# Patient Record
Sex: Male | Born: 1980 | Race: Asian | Hispanic: No | Marital: Married | Smoking: Former smoker
Health system: Southern US, Community
[De-identification: ages and names within clinical notes are randomized; demographics above are authoritative.]

## PROBLEM LIST (undated history)

## (undated) DIAGNOSIS — I10 Essential (primary) hypertension: Secondary | ICD-10-CM

## (undated) DIAGNOSIS — E7800 Pure hypercholesterolemia, unspecified: Secondary | ICD-10-CM

---

## 2011-05-10 ENCOUNTER — Ambulatory Visit: Payer: Self-pay | Admitting: Internal Medicine

## 2011-05-10 LAB — CREATININE, SERUM
Creatinine: 0.88 mg/dL (ref 0.60–1.30)
EGFR (African American): >60

## 2015-01-27 ENCOUNTER — Encounter: Payer: Self-pay | Admitting: *Deleted

## 2015-01-30 ENCOUNTER — Ambulatory Visit
Admission: RE | Admit: 2015-01-30 | Discharge: 2015-01-30 | Disposition: A | Discharge status: Home / Self Care | Payer: Managed Care, Other (non HMO) | Source: Ambulatory Visit | Attending: Gastroenterology | Admitting: Gastroenterology

## 2015-01-30 ENCOUNTER — Ambulatory Visit: Payer: Managed Care, Other (non HMO) | Admitting: Anesthesiology

## 2015-01-30 ENCOUNTER — Encounter
Admission: RE | Disposition: A | Discharge status: Home / Self Care | Payer: Self-pay | Source: Ambulatory Visit | Attending: Gastroenterology

## 2015-01-30 DIAGNOSIS — Z79899 Other long term (current) drug therapy: Secondary | ICD-10-CM | POA: Insufficient documentation

## 2015-01-30 DIAGNOSIS — K2970 Gastritis, unspecified, without bleeding: Secondary | ICD-10-CM | POA: Insufficient documentation

## 2015-01-30 DIAGNOSIS — K2980 Duodenitis without bleeding: Secondary | ICD-10-CM | POA: Insufficient documentation

## 2015-01-30 DIAGNOSIS — K219 Gastro-esophageal reflux disease without esophagitis: Secondary | ICD-10-CM | POA: Diagnosis not present

## 2015-01-30 DIAGNOSIS — Z87891 Personal history of nicotine dependence: Secondary | ICD-10-CM | POA: Insufficient documentation

## 2015-01-30 DIAGNOSIS — E7800 Pure hypercholesterolemia, unspecified: Secondary | ICD-10-CM | POA: Insufficient documentation

## 2015-01-30 DIAGNOSIS — I10 Essential (primary) hypertension: Secondary | ICD-10-CM | POA: Insufficient documentation

## 2015-01-30 DIAGNOSIS — K208 Other esophagitis: Secondary | ICD-10-CM | POA: Insufficient documentation

## 2015-01-30 DIAGNOSIS — R1013 Epigastric pain: Secondary | ICD-10-CM | POA: Diagnosis present

## 2015-01-30 HISTORY — PX: ESOPHAGOGASTRODUODENOSCOPY (EGD) WITH PROPOFOL: SHX5813

## 2015-01-30 HISTORY — DX: Essential (primary) hypertension: I10

## 2015-01-30 HISTORY — DX: Pure hypercholesterolemia, unspecified: E78.00

## 2015-01-30 SURGERY — ESOPHAGOGASTRODUODENOSCOPY (EGD) WITH PROPOFOL
Anesthesia: General

## 2015-01-30 MED ORDER — GLYCOPYRROLATE 0.2 MG/ML IJ SOLN
INTRAMUSCULAR | Status: DC | PRN
Start: 2015-01-30 — End: 2015-01-30
  Administered 2015-01-30: 0.2 mg via INTRAVENOUS

## 2015-01-30 MED ORDER — FENTANYL CITRATE (PF) 100 MCG/2ML IJ SOLN
INTRAMUSCULAR | Status: DC | PRN
  Administered 2015-01-30: 50 ug via INTRAVENOUS

## 2015-01-30 MED ORDER — PROPOFOL 500 MG/50ML IV EMUL: INTRAVENOUS | Status: DC | PRN

## 2015-01-30 MED ORDER — PROPOFOL 10 MG/ML IV BOLUS
INTRAVENOUS | Status: DC | PRN
  Administered 2015-01-30: 100 mg via INTRAVENOUS

## 2015-01-30 MED ORDER — LIDOCAINE HCL (CARDIAC) 20 MG/ML IV SOLN
INTRAVENOUS | Status: DC | PRN
  Administered 2015-01-30: 100 mg via INTRAVENOUS

## 2015-01-30 MED ORDER — SODIUM CHLORIDE 0.9 % IV SOLN
INTRAVENOUS | Status: DC
  Administered 2015-01-30: 16:00:00 via INTRAVENOUS

## 2015-01-30 MED ORDER — SODIUM CHLORIDE 0.9 % IV SOLN
INTRAVENOUS | Status: DC
  Administered 2015-01-30: 15:00:00 via INTRAVENOUS

## 2015-01-30 MED ORDER — MIDAZOLAM HCL 5 MG/5ML IJ SOLN
INTRAMUSCULAR | Status: DC | PRN
  Administered 2015-01-30: 2 mg via INTRAVENOUS

## 2015-01-30 NOTE — Transfer of Care (Signed)
Immediate Anesthesia Transfer of Care Note  Patient: Daniel Hudson  Procedure(s) Performed: Procedure(s): ESOPHAGOGASTRODUODENOSCOPY (EGD) WITH PROPOFOL (N/A)  Patient Location: PACU  Anesthesia Type:General  Level of Consciousness: sedated  Airway & Oxygen Therapy: Patient Spontanous Breathing and Patient connected to nasal cannula oxygen  Post-op Assessment: Report given to RN and Post -op Vital signs reviewed and stable  Post vital signs: Reviewed and stable  Last Vitals:  Filed Vitals:   01/30/15 1437  BP: 112/81  Temp: 37.1 C  Resp: 20    Complications: No apparent anesthesia complications

## 2015-01-30 NOTE — Anesthesia Preprocedure Evaluation (Signed)
Anesthesia Evaluation  Patient identified by MRN, date of birth, ID band Patient awake    Reviewed: Allergy & Precautions, NPO status , Patient's Chart, lab work & pertinent test results  Airway Mallampati: II       Dental  (+) Teeth Intact   Pulmonary former smoker,    Pulmonary exam normal        Cardiovascular hypertension, Pt. on medications negative cardio ROS Normal cardiovascular exam     Neuro/Psych negative neurological ROS     GI/Hepatic negative GI ROS, Neg liver ROS,   Endo/Other  negative endocrine ROS  Renal/GU negative Renal ROS     Musculoskeletal negative musculoskeletal ROS (+)   Abdominal Normal abdominal exam  (+)   Peds negative pediatric ROS (+)  Hematology negative hematology ROS (+)   Anesthesia Other Findings   Reproductive/Obstetrics negative OB ROS                             Anesthesia Physical Anesthesia Plan  ASA: II  Anesthesia Plan: General   Post-op Pain Management:    Induction: Intravenous  Airway Management Planned: Nasal Cannula  Additional Equipment:   Intra-op Plan:   Post-operative Plan:   Informed Consent: I have reviewed the patients History and Physical, chart, labs and discussed the procedure including the risks, benefits and alternatives for the proposed anesthesia with the patient or authorized representative who has indicated his/her understanding and acceptance.     Plan Discussed with: CRNA  Anesthesia Plan Comments:         Anesthesia Quick Evaluation

## 2015-01-30 NOTE — Op Note (Addendum)
[name] Gastroenterology Patient Name: Daniel MastersSaeed Reinig Procedure Date: 01/30/2015 3:43 PM MRN: [id number] Account #: [id number] Date of Birth: 02/05/1981 Admit Type: Outpatient Age: [age] Room[facility] ENDO ROOM 3 Gender: Male Note Status: Supervisor Override Procedure:         Upper GI endoscopy Indications:       Dyspepsia Providers:         Christena DeemMartin U. Skulskie, MD Referring MD:      Silas FloodSheikh A. Ellsworth Lennoxejan-sie, MD (Referring MD) Medicines:         Monitored Anesthesia Care Complications:     No immediate complications. Procedure:         Pre-Anesthesia Assessment:                    - ASA Grade Assessment: II - A patient with mild systemic                     disease.                    After obtaining informed consent, the endoscope was passed                     under direct vision. Throughout the procedure, the                     patient's blood pressure, pulse, and oxygen saturations                     were monitored continuously. The Endoscope was introduced                     through the mouth, and advanced to the third part of                     duodenum. The patient tolerated the procedure well. Findings:      LA Grade B (one or more mucosal breaks greater than 5 mm, not extending       between the tops of two mucosal folds) esophagitis with no bleeding was       found. Biopsies were taken with a cold forceps for histology.      Diffuse minimal erythematous mucosa without bleeding was found in the       gastric body. Biopsies were taken with a cold forceps for histology.       Biopsies were taken with a cold forceps for Helicobacter pylori testing.      The cardia and gastric fundus were normal on retroflexion.      Patchy moderate inflammation characterized by congestion (edema),       erosions and erythema was found in the posterior duodenal bulb and in       the proximal second part of the duodenum.      small nodule, possible granuloma above the vocal  cord. Impression:        - LA Grade B erosive esophagitis. Biopsied.                    - Erythematous mucosa in the gastric body. Biopsied.                    - Erosive duodenitis. Recommendation:    - Use Protonix (pantoprazole) 40 mg PO BID for 1 month.                    -  Await pathology results.                    - Return to GI clinic in 1 month.                    - Refer to an ENT specialist at appointment to be                     scheduled. Procedure Code(s): --- Professional ---                    [id number], Esophagogastroduodenoscopy, flexible, transoral;                     with biopsy, single or multiple Diagnosis Code(s): --- Professional ---                    530.19, Other esophagitis                    537.89, Other specified disorders of stomach and duodenum                    535.60, Duodenitis, without mention of hemorrhage                    536.8, Dyspepsia and other specified disorders of function                     of stomach CPT copyright 2014 American Medical Association. All rights reserved. The codes documented in this report are preliminary and upon coder review may  be revised to meet current compliance requirements. [name], MD 01/30/2015 4:07:59 PM This report has been signed electronically. Number of Addenda: 0 Note Initiated On: 01/30/2015 3:43 PM      [facility]

## 2015-01-30 NOTE — Anesthesia Postprocedure Evaluation (Signed)
  Anesthesia Post-op Note  Patient: Daniel MastersSaeed Hudson  Procedure(s) Performed: Procedure(s): ESOPHAGOGASTRODUODENOSCOPY (EGD) WITH PROPOFOL (N/A)  Anesthesia type:General  Patient location: PACU  Post pain: Pain level controlled  Post assessment: Post-op Vital signs reviewed, Patient's Cardiovascular Status Stable, Respiratory Function Stable, Patent Airway and No signs of Nausea or vomiting  Post vital signs: Reviewed and stable  Last Vitals:  Filed Vitals:   01/30/15 1655  BP: 132/89  Pulse:   Temp:   Resp:     Level of consciousness: awake, alert  and patient cooperative  Complications: No apparent anesthesia complications

## 2015-01-30 NOTE — H&P (Signed)
Outpatient short stay form Pre-procedure 01/30/2015 3:32 PM Daniel Hudson Renada Cronin MD  Primary Physician: Dr. Ellsworth Lennoxejan-Sie  Reason for visit:  EGD  History of present illness:  Patient is a 34 year old male presenting with complaint of dyspepsia. He also describes reflux and heartburn symptoms. He states that times his time will appear coated white. Not noted any on the inside of his cheeks. Icy foods seem to make symptoms worse and will occasionally trigger loose stools. He has been taking pantoprazole 40 mg daily which helps with the pain but not the discomfort otherwise. He had been taking it after dinner instead of before. When he saw Mrs. London she tolerated to take it appropriately before a meal.  He denies the use of aspirin or NSAID products. He does take Tylenol No. 3 or headaches.    Current facility-administered medications:  .  0.9 %  sodium chloride infusion, , Intravenous, Continuous, Daniel Hudson Raidyn Wassink, MD, Last Rate: 20 mL/hr at 01/30/15 1450 .  0.9 %  sodium chloride infusion, , Intravenous, Continuous, Daniel Hudson Leshon Armistead, MD  Prescriptions prior to admission  Medication Sig Dispense Refill Last Dose  . acetaminophen-codeine (TYLENOL #3) 300-30 MG tablet Take 1 tablet by mouth every 4 (four) hours as needed for moderate pain.   Past Week at Unknown time  . escitalopram (LEXAPRO) 10 MG tablet Take 10 mg by mouth daily.   01/29/2015 at Unknown time  . pantoprazole (PROTONIX) 40 MG tablet Take 40 mg by mouth daily.   01/29/2015 at Unknown time  . propranolol (INDERAL) 40 MG tablet Take 40 mg by mouth 3 (three) times daily.   01/29/2015 at Unknown time  . triamcinolone cream (KENALOG) 0.5 % Apply 1 application topically 3 (three) times daily.   Not Taking at Unknown time     No Known Allergies   Past Medical History  Diagnosis Date  . Hypertension   . High cholesterol     Review of systems:      Physical Exam    Heart and lungs: Regular rate and rhythm without rub or  gallop, lungs are bilaterally clear    HEENT: Normocephalic atraumatic eyes are anicteric    Other:     Pertinant exam for procedure: Soft nontender nondistended bowel sounds positive normoactive    Planned proceedures: EGD and indicated procedures I have discussed the risks benefits and complications of procedures to include not limited to bleeding, infection, perforation and the risk of sedation and the patient wishes to proceed.    Daniel Hudson Tamekia Rotter, MD Gastroenterology 01/30/2015  3:32 PM

## 2015-02-01 LAB — SURGICAL PATHOLOGY

## 2015-02-02 ENCOUNTER — Encounter: Payer: Self-pay | Admitting: Gastroenterology

## 2017-05-25 DIAGNOSIS — I4891 Unspecified atrial fibrillation: Secondary | ICD-10-CM | POA: Diagnosis not present

## 2017-05-25 DIAGNOSIS — I10 Essential (primary) hypertension: Secondary | ICD-10-CM | POA: Insufficient documentation

## 2017-05-25 DIAGNOSIS — R61 Generalized hyperhidrosis: Secondary | ICD-10-CM | POA: Diagnosis not present

## 2017-05-25 DIAGNOSIS — F17210 Nicotine dependence, cigarettes, uncomplicated: Secondary | ICD-10-CM | POA: Diagnosis not present

## 2017-05-25 DIAGNOSIS — R42 Dizziness and giddiness: Secondary | ICD-10-CM | POA: Insufficient documentation

## 2017-05-25 DIAGNOSIS — Z79899 Other long term (current) drug therapy: Secondary | ICD-10-CM | POA: Diagnosis not present

## 2017-05-25 DIAGNOSIS — R51 Headache: Secondary | ICD-10-CM | POA: Diagnosis present

## 2017-05-25 NOTE — ED Triage Notes (Signed)
Pt reports migraine since 1500 today, getting worse despite taking his pain medication. Ems concerned about pt's presentation, unable to auscultate bp, however able to palpate a systolic pressure, pt reports vomiting 4 times since onset of headache

## 2017-05-26 ENCOUNTER — Other Ambulatory Visit: Payer: Self-pay

## 2017-05-26 ENCOUNTER — Emergency Department
Admission: EM | Admit: 2017-05-26 | Discharge: 2017-05-26 | Disposition: A | Discharge status: Home / Self Care | Payer: Managed Care, Other (non HMO) | Attending: Emergency Medicine | Admitting: Emergency Medicine

## 2017-05-26 ENCOUNTER — Emergency Department: Payer: Managed Care, Other (non HMO)

## 2017-05-26 ENCOUNTER — Encounter: Payer: Self-pay | Admitting: Emergency Medicine

## 2017-05-26 DIAGNOSIS — R519 Headache, unspecified: Secondary | ICD-10-CM

## 2017-05-26 DIAGNOSIS — R51 Headache: Secondary | ICD-10-CM

## 2017-05-26 DIAGNOSIS — I4891 Unspecified atrial fibrillation: Secondary | ICD-10-CM

## 2017-05-26 LAB — CBC
HEMATOCRIT: 46.2 % (ref 40.0–52.0)
Hemoglobin: 15.5 g/dL (ref 13.0–18.0)
MCH: 25.2 pg — AB (ref 26.0–34.0)
MCHC: 33.5 g/dL (ref 32.0–36.0)
MCV: 75.4 fL — AB (ref 80.0–100.0)
Platelets: 232 10*3/uL (ref 150–440)
RBC: 6.13 MIL/uL — AB (ref 4.40–5.90)
RDW: 13.7 % (ref 11.5–14.5)
WBC: 12.2 10*3/uL — AB (ref 3.8–10.6)

## 2017-05-26 LAB — COMPREHENSIVE METABOLIC PANEL
ALBUMIN: 4.6 g/dL (ref 3.5–5.0)
ALK PHOS: 90 U/L (ref 38–126)
ALT: 28 U/L (ref 17–63)
AST: 25 U/L (ref 15–41)
Anion gap: 9 (ref 5–15)
BILIRUBIN TOTAL: 0.7 mg/dL (ref 0.3–1.2)
BUN: 11 mg/dL (ref 6–20)
CALCIUM: 9.2 mg/dL (ref 8.9–10.3)
CO2: 24 mmol/L (ref 22–32)
Chloride: 101 mmol/L (ref 101–111)
Creatinine, Ser: 0.81 mg/dL (ref 0.61–1.24)
GFR calc Af Amer: >60 mL/min (ref >60)
GFR calc non Af Amer: >60 mL/min (ref >60)
GLUCOSE: 195 mg/dL — AB (ref 65–99)
Potassium: 4.2 mmol/L (ref 3.5–5.1)
Sodium: 134 mmol/L — ABNORMAL LOW (ref 135–145)
TOTAL PROTEIN: 8.2 g/dL — AB (ref 6.5–8.1)

## 2017-05-26 LAB — INFLUENZA PANEL BY PCR (TYPE A & B)
INFLBPCR: NEGATIVE
Influenza A By PCR: NEGATIVE

## 2017-05-26 LAB — TROPONIN I: Troponin I: <0.03 ng/mL (ref <0.03)

## 2017-05-26 LAB — MAGNESIUM: Magnesium: 1.7 mg/dL (ref 1.7–2.4)

## 2017-05-26 LAB — TSH: TSH: 0.42 u[IU]/mL (ref 0.350–4.500)

## 2017-05-26 MED ORDER — BUTALBITAL-APAP-CAFFEINE 50-325-40 MG PO TABS: 1.0000 | ORAL_TABLET | Freq: Four times a day (QID) | ORAL | 0 refills | Status: AC | PRN

## 2017-05-26 MED ORDER — MAGNESIUM OXIDE 400 (241.3 MG) MG PO TABS
400.0000 mg | ORAL_TABLET | Freq: Once | ORAL | Status: AC
  Administered 2017-05-26: 400 mg via ORAL
  Filled 2017-05-26: qty 1

## 2017-05-26 MED ORDER — ONDANSETRON HCL 4 MG/2ML IJ SOLN
INTRAMUSCULAR | Status: AC
  Administered 2017-05-26: 4 mg
  Filled 2017-05-26: qty 2

## 2017-05-26 MED ORDER — METOPROLOL TARTRATE 5 MG/5ML IV SOLN
2.5000 mg | Freq: Once | INTRAVENOUS | Status: AC
Start: 2017-05-26 — End: 2017-05-26
  Administered 2017-05-26: 2.5 mg via INTRAVENOUS
  Filled 2017-05-26: qty 5

## 2017-05-26 MED ORDER — METOPROLOL TARTRATE 5 MG/5ML IV SOLN
2.5000 mg | Freq: Once | INTRAVENOUS | Status: AC
  Administered 2017-05-26: 2.5 mg via INTRAVENOUS

## 2017-05-26 MED ORDER — BUTALBITAL-APAP-CAFFEINE 50-325-40 MG PO TABS
2.0000 | ORAL_TABLET | Freq: Once | ORAL | Status: AC
  Administered 2017-05-26: 2 via ORAL
  Filled 2017-05-26: qty 2

## 2017-05-26 MED ORDER — METOPROLOL TARTRATE 5 MG/5ML IV SOLN
5.0000 mg | Freq: Once | INTRAVENOUS | Status: DC
  Filled 2017-05-26: qty 5

## 2017-05-26 MED ORDER — METOCLOPRAMIDE HCL 5 MG/ML IJ SOLN
10.0000 mg | Freq: Once | INTRAMUSCULAR | Status: AC
  Administered 2017-05-26: 10 mg via INTRAVENOUS
  Filled 2017-05-26: qty 2

## 2017-05-26 MED ORDER — SODIUM CHLORIDE 0.9 % IV BOLUS (SEPSIS)
1000.0000 mL | Freq: Once | INTRAVENOUS | Status: AC
  Administered 2017-05-26: 1000 mL via INTRAVENOUS

## 2017-05-26 MED ORDER — KETOROLAC TROMETHAMINE 30 MG/ML IJ SOLN
30.0000 mg | Freq: Once | INTRAMUSCULAR | Status: AC
  Administered 2017-05-26: 30 mg via INTRAVENOUS
  Filled 2017-05-26: qty 1

## 2017-05-26 MED ORDER — DIPHENHYDRAMINE HCL 50 MG/ML IJ SOLN
25.0000 mg | Freq: Once | INTRAMUSCULAR | Status: AC
  Administered 2017-05-26: 25 mg via INTRAVENOUS
  Filled 2017-05-26: qty 1

## 2017-05-26 NOTE — Discharge Instructions (Signed)
Please follow up with cardiology for further evaluation of your atrial fibrillation. Please return with any other concern.

## 2017-05-26 NOTE — ED Notes (Signed)
Pt reports headache, dizziness, for 3 days.  No n/v/d  Pt has not taken any meds tonight for headache.  Pt denies chest pain or sob.  Pt alert, speech clear. Family with pt.  Pt reports flu exposure with family member in the home.  Pt denies cough, fever.

## 2017-05-26 NOTE — ED Notes (Signed)
Report off to david rn 

## 2017-05-26 NOTE — ED Notes (Signed)
Pt states feeling better after meds.  Family with pt. Sinus tach on monitor at 109

## 2017-05-26 NOTE — ED Provider Notes (Signed)
[facility] Emergency Department Provider Note   ____________________________________________   First MD Initiated Contact with Patient 05/26/17 [phone]     (approximate)  I have reviewed the triage vital signs and the nursing notes.   HISTORY  Chief Complaint Headache and Irregular Heart Beat    HPI Daniel Hudson is a 37 y.o. male who comes into the hospital today with a headache.  The patient states that it started this afternoon.  He has a history of migraines and gets headaches sometimes every few days.  The patient's last headache was about 3 weeks ago.  The patient reports that this headache was so severe though that it started making him vomit multiple times.  He also felt dizzy and had a cold sweat.  The patient states that this headache is worse than his previous headaches.  Stabbing.  It started on the right side but is now moved to the left.  The patient rates his pain a 9 out of 10 in intensity.  He denies any blurred vision but he is sensitive to light and sound.  The patient states that he takes propranolol Lexapro and sometimes for his headaches Tylenol 3.  He is here today for evaluation.  Past Medical History:  Diagnosis Date  . High cholesterol   . Hypertension     There are no active problems to display for this patient.   Past Surgical History:  Procedure Laterality Date  . ESOPHAGOGASTRODUODENOSCOPY (EGD) WITH PROPOFOL N/A 01/30/2015   Procedure: ESOPHAGOGASTRODUODENOSCOPY (EGD) WITH PROPOFOL;  Surgeon: [name], MD;  Location: [facility] ENDOSCOPY;  Service: Endoscopy;  Laterality: N/A;    Prior to Admission medications   Medication Sig Start Date End Date Taking? Authorizing Provider  escitalopram (LEXAPRO) 10 MG tablet Take 10 mg by mouth daily.   Yes [provider]  propranolol (INDERAL) 40 MG tablet Take 40 mg by mouth 3 (three) times daily.   Yes [provider]  sildenafil (REVATIO) 20 MG tablet Take one  tablet by mouth one hour prior to sex as needed. Do not take more than one dose every 24 hours. 03/30/17  Yes [provider]  butalbital-acetaminophen-caffeine Marikay Alar, ESGIC) [phone] MG tablet Take 1-2 tablets by mouth every 6 (six) hours as needed for headache. 05/26/17 05/26/18  Rebecka Apley, MD    Allergies Patient has no known allergies.  History reviewed. No pertinent family history.  Social History Social History   Tobacco Use  . Smoking status: Light Tobacco Smoker  . Smokeless tobacco: Never Used  Substance Use Topics  . Alcohol use: No  . Drug use: No    Review of Systems  Constitutional: Chills and sweats Eyes: No visual changes. ENT: No sore throat. Cardiovascular: Denies chest pain. Respiratory: Denies shortness of breath. Gastrointestinal:  nausea, no vomiting, diarrhea.  No constipation. Genitourinary: Negative for dysuria. Musculoskeletal: Negative for back pain. Skin: Negative for rash. Neurological: Headache   ____________________________________________   PHYSICAL EXAM:  VITAL SIGNS: ED Triage Vitals  Enc Vitals Group     BP 05/25/17 2354 (!) 126/97     Pulse Rate 05/25/17 2354 93     Resp 05/25/17 2354 20     Temp --      Temp src --      SpO2 05/25/17 2354 99 %     Weight 05/25/17 2354 215 lb (97.5 kg)     Height 05/25/17 2354 5\' 10"  (1.778 m)     Head Circumference --  Peak Flow --      Pain Score 05/26/17 0007 10     Pain Loc --      Pain Edu? --      Excl. in GC? --     Constitutional: Alert and oriented. Well appearing and in moderate distress. Eyes: Conjunctivae are normal. PERRL. EOMI. Head: Atraumatic. Nose: No congestion/rhinnorhea. Mouth/Throat: Mucous membranes are moist.  Oropharynx non-erythematous. Cardiovascular: Normal rate, regular rhythm. Grossly normal heart sounds.  Good peripheral circulation. Respiratory: Normal respiratory effort.  No retractions. Lungs CTAB. Gastrointestinal: Soft and  nontender. No distention. No abdominal bruits. No CVA tenderness. Musculoskeletal: No lower extremity tenderness nor edema.   Neurologic:  Normal speech and language.  Cranial nerves II through XII are grossly intact with no focal motor neuro deficit Skin:  Skin is warm, dry and intact.  Psychiatric: Mood and affect are normal.   ____________________________________________   LABS (all labs ordered are listed, but only abnormal results are displayed)  Labs Reviewed  CBC - Abnormal; Notable for the following components:      Result Value   WBC 12.2 (*)    RBC 6.13 (*)    MCV 75.4 (*)    MCH 25.2 (*)    All other components within normal limits  COMPREHENSIVE METABOLIC PANEL - Abnormal; Notable for the following components:   Sodium 134 (*)    Glucose, Bld 195 (*)    Total Protein 8.2 (*)    All other components within normal limits  TROPONIN I  INFLUENZA PANEL BY PCR (TYPE A & B)  MAGNESIUM  TSH   ____________________________________________  EKG  ED ECG REPORT I, Rebecka Apley, the attending physician, personally viewed and interpreted this ECG.   Date: 05/25/2017  EKG Time: 2350  Rate: 98  Rhythm: atrial fibrillation, rate 98  Axis: normal  Intervals:none  ST&T Change: none  ____________________________________________  RADIOLOGY  ED MD interpretation:  CT head: NAD  Official radiology report(s): Ct Head Wo Contrast  Result Date: 05/26/2017 CLINICAL DATA:  Acute severe headache. Worst headache of life. Vomiting. EXAM: CT HEAD WITHOUT CONTRAST TECHNIQUE: Contiguous axial images were obtained from the base of the skull through the vertex without intravenous contrast. COMPARISON:  Brain MRI 05/10/2011 FINDINGS: Brain: No intracranial hemorrhage, mass effect, or midline shift. No hydrocephalus. The basilar cisterns are patent. No evidence of territorial infarct or acute ischemia. No extra-axial or intracranial fluid collection. Vascular: Linear density in the  left temporal lobe corresponds to developmental venous anomaly as seen on prior MRI. No hyperdense vessel to suggest acute thrombus. Skull: No fracture or focal lesion. Sinuses/Orbits: Paranasal sinuses and mastoid air cells are clear. The visualized orbits are unremarkable. Other: None. IMPRESSION: 1.  No acute intracranial abnormality. 2. 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 Electronically Signed   By: Rubye Oaks M.D.   On: 05/26/2017 00:29    ____________________________________________   PROCEDURES  Procedure(s) performed: None  Procedures  Critical Care performed: No  ____________________________________________   INITIAL IMPRESSION / ASSESSMENT AND PLAN / ED COURSE  As part of my medical decision making, I reviewed the following data within the electronic MEDICAL RECORD NUMBER Notes from prior ED visits and [place] Controlled Substance Database   This is a 37 year old male who comes into the hospital today with a headache.  My differential diagnosis includes migraine headache, electrolyte abnormality, intracranial hemorrhage.  I did send the patient for a CT scan of his head.  The CT scan was unremarkable.  Also check a CBC troponin and a CMP which was negative.  The patient did ask about influenza so we also checked an influenza which was negative.  I gave the patient some Reglan, Benadryl, Toradol and a liter of normal saline.  His headache improved but he was little bit tachycardic.  I gave the patient some Lopressor and another liter bolus of normal saline.  He received some Fioricet.  I am still awaiting the results of the magnesium and a TSH.       I went into the room to reassess the patient.  His headache was improved.  I did have a conversation with the patient about lumbar punctures for further evaluation but the patient declined.  He did receive a dose of Fioricet.  I gave him his Lopressor and his heart rate improved  into the 90s steadily.  I discussed with the patient that he does have some new onset atrial fibrillation but he needs to follow-up with cardiology.  The patient's magnesium was 1.7 so I did give him a dose of magnesium oxide orally.  The patient will be discharged home to follow-up with his primary care physician as well as cardiology. ____________________________________________   FINAL CLINICAL IMPRESSION(S) / ED DIAGNOSES  Final diagnoses:  Acute nonintractable headache, unspecified headache type  Atrial fibrillation, unspecified type [facility](HCC)     ED Discharge Orders        Ordered    butalbital-acetaminophen-caffeine (FIORICET, ESGIC) 50-325-40 MG tablet  Every 6 hours PRN     05/26/17 0454       Note:  This document was prepared using Dragon voice recognition software and may include unintentional dictation errors.    Rebecka ApleyWebster, Yanet Balliet P, MD 05/26/17 [phone]

## 2017-06-11 ENCOUNTER — Ambulatory Visit: Payer: Managed Care, Other (non HMO) | Admitting: Urology

## 2017-06-11 ENCOUNTER — Encounter: Payer: Self-pay | Admitting: Urology

## 2017-07-03 ENCOUNTER — Ambulatory Visit: Payer: Managed Care, Other (non HMO) | Admitting: Cardiovascular Disease

## 2019-04-23 ENCOUNTER — Ambulatory Visit: Payer: Managed Care, Other (non HMO) | Attending: Internal Medicine

## 2019-04-23 DIAGNOSIS — Z20822 Contact with and (suspected) exposure to covid-19: Secondary | ICD-10-CM

## 2019-04-24 LAB — NOVEL CORONAVIRUS, NAA: SARS-CoV-2, NAA: DETECTED — AB

## 2020-11-16 ENCOUNTER — Other Ambulatory Visit: Payer: Self-pay | Admitting: Rheumatology

## 2020-11-16 DIAGNOSIS — M2392 Unspecified internal derangement of left knee: Secondary | ICD-10-CM

## 2020-12-01 ENCOUNTER — Other Ambulatory Visit: Payer: Managed Care, Other (non HMO)

## 2020-12-04 ENCOUNTER — Ambulatory Visit
Admission: RE | Admit: 2020-12-04 | Discharge: 2020-12-04 | Disposition: A | Discharge status: Home / Self Care | Payer: Managed Care, Other (non HMO) | Source: Ambulatory Visit | Attending: Rheumatology | Admitting: Rheumatology

## 2020-12-04 ENCOUNTER — Other Ambulatory Visit: Payer: Self-pay

## 2020-12-04 DIAGNOSIS — M2392 Unspecified internal derangement of left knee: Secondary | ICD-10-CM | POA: Diagnosis not present

## 2021-10-15 ENCOUNTER — Ambulatory Visit
Admission: EM | Admit: 2021-10-15 | Discharge: 2021-10-15 | Disposition: A | Discharge status: Home / Self Care | Payer: Managed Care, Other (non HMO)

## 2021-10-15 DIAGNOSIS — Z1152 Encounter for screening for COVID-19: Secondary | ICD-10-CM

## 2021-10-15 DIAGNOSIS — B349 Viral infection, unspecified: Secondary | ICD-10-CM

## 2021-10-15 LAB — POCT RAPID STREP A (OFFICE): Rapid Strep A Screen: NEGATIVE

## 2021-10-15 NOTE — ED Triage Notes (Signed)
Patient presents to Urgent Care with complaints of sore throat, fever, and cough x 3 days. Taking mucinex and tylenol.

## 2021-10-15 NOTE — Discharge Instructions (Addendum)
The strep test is negative.    Your COVID test is pending.  Take Tylenol or ibuprofen as needed for fever or discomfort.  Rest and keep yourself hydrated.    Follow-up with your primary care provider if your symptoms are not improving.     

## 2021-10-15 NOTE — ED Provider Notes (Signed)
Renaldo Fiddler    CSN: [id number] Arrival date & time: 10/15/21  1506      History   Chief Complaint Chief Complaint  Patient presents with   Sore Throat   Cough   Fever    HPI Daniel Hudson is a 41 y.o. male.  Accompanied by his wife, patient presents with 3-day history of fever, chills, body aches, sore throat, cough.  Tmax 99.  Treatment at home with Tylenol and Mucinex.  He denies chest pain, shortness of breath, vomiting, diarrhea, or other symptoms.  Patient requests strep and COVID testing.  His medical history includes hypertension.   The history is provided by the patient, the spouse and medical records.    Past Medical History:  Diagnosis Date   High cholesterol    Hypertension     There are no problems to display for this patient.   Past Surgical History:  Procedure Laterality Date   ESOPHAGOGASTRODUODENOSCOPY (EGD) WITH PROPOFOL N/A 01/30/2015   Procedure: ESOPHAGOGASTRODUODENOSCOPY (EGD) WITH PROPOFOL;  Surgeon: [name], MD;  Location: [facility] ENDOSCOPY;  Service: Endoscopy;  Laterality: N/A;       Home Medications    Prior to Admission medications   Medication Sig Start Date End Date Taking? Authorizing Provider  escitalopram (LEXAPRO) 10 MG tablet Take 10 mg by mouth daily.    [provider]  propranolol (INDERAL) 40 MG tablet Take 40 mg by mouth 3 (three) times daily.    [provider]  rosuvastatin (CRESTOR) 20 MG tablet Take 20 mg by mouth at bedtime. 09/12/21   [provider]  sildenafil (REVATIO) 20 MG tablet Take one tablet by mouth one hour prior to sex as needed. Do not take more than one dose every 24 hours. 03/30/17   [provider]    Family History History reviewed. No pertinent family history.  Social History Social History   Tobacco Use   Smoking status: Light Smoker   Smokeless tobacco: Never  Substance Use Topics   Alcohol use: No   Drug use: No     Allergies   Patient  has no known allergies.   Review of Systems Review of Systems  Constitutional:  Positive for chills and fever.  HENT:  Positive for sore throat. Negative for ear pain.   Respiratory:  Positive for cough. Negative for shortness of breath.   Cardiovascular:  Negative for chest pain and palpitations.  Gastrointestinal:  Negative for abdominal pain, diarrhea and vomiting.  Skin:  Negative for color change and rash.  All other systems reviewed and are negative.    Physical Exam Triage Vital Signs ED Triage Vitals  Enc Vitals Group     BP      Pulse      Resp      Temp      Temp src      SpO2      Weight      Height      Head Circumference      Peak Flow      Pain Score      Pain Loc      Pain Edu?      Excl. in GC?    No data found.  Updated Vital Signs BP 113/77 (BP Location: Left Arm)   Pulse 85   Temp 99.1 F (37.3 C) (Oral)   Resp 16   SpO2 98%   Visual Acuity Right Eye Distance:   Left Eye Distance:   Bilateral  Distance:    Right Eye Near:   Left Eye Near:    Bilateral Near:     Physical Exam Vitals and nursing note reviewed.  Constitutional:      General: He is not in acute distress.    Appearance: Normal appearance. He is well-developed. He is not ill-appearing.  HENT:     Right Ear: Tympanic membrane normal.     Left Ear: Tympanic membrane normal.     Nose: Rhinorrhea present.     Mouth/Throat:     Mouth: Mucous membranes are moist.     Pharynx: Posterior oropharyngeal erythema present.  Cardiovascular:     Rate and Rhythm: Normal rate and regular rhythm.     Heart sounds: Normal heart sounds.  Pulmonary:     Effort: Pulmonary effort is normal. No respiratory distress.     Breath sounds: Normal breath sounds.  Musculoskeletal:     Cervical back: Neck supple.  Skin:    General: Skin is warm and dry.  Neurological:     Mental Status: He is alert.  Psychiatric:        Mood and Affect: Mood normal.        Behavior: Behavior normal.       UC Treatments / Results  Labs (all labs ordered are listed, but only abnormal results are displayed) Labs Reviewed  NOVEL CORONAVIRUS, NAA  POCT RAPID STREP A (OFFICE)    EKG   Radiology No results found.  Procedures Procedures (including critical care time)  Medications Ordered in UC Medications - No data to display  Initial Impression / Assessment and Plan / UC Course  I have reviewed the triage vital signs and the nursing notes.  Pertinent labs & imaging results that were available during my care of the patient were reviewed by me and considered in my medical decision making (see chart for details).    Viral illness.  Rapid strep negative.  COVID pending.  Discussed symptomatic treatment including Tylenol or ibuprofen, rest, hydration.  Instructed patient to follow up with PCP if symptoms are not improving.  Patient agrees to plan of care.   Final Clinical Impressions(s) / UC Diagnoses   Final diagnoses:  Viral illness     Discharge Instructions      The strep test is negative.  Your COVID test is pending.    Take Tylenol or ibuprofen as needed for fever or discomfort.  Rest and keep yourself hydrated.    Follow-up with your primary care provider if your symptoms are not improving.         ED Prescriptions   None    PDMP not reviewed this encounter.   Mickie Bail, NP 10/15/21 1538

## 2021-10-16 ENCOUNTER — Telehealth (HOSPITAL_COMMUNITY): Payer: Self-pay | Admitting: Emergency Medicine

## 2021-10-16 LAB — NOVEL CORONAVIRUS, NAA: SARS-CoV-2, NAA: DETECTED — AB

## 2021-10-16 MED ORDER — IBUPROFEN 600 MG PO TABS: 600.0000 mg | ORAL_TABLET | Freq: Four times a day (QID) | ORAL | 0 refills | Status: AC | PRN

## 2022-04-04 IMAGING — MR MR KNEE*L* W/O CM
7 series · 40 of 40 positions shown · non-contrast
Comparison: None.

CLINICAL DATA: Anterior knee pain for 10 years. No known injury.
Worsening pain with walking, running and stair climbing.

EXAM:
MRI OF THE LEFT KNEE WITHOUT CONTRAST
TECHNIQUE: Multiplanar, multisequence MR imaging of the knee was performed. No
intravenous contrast was administered.

[Series 8: T2 fat-sat · axial · left · 4.0mm · 0.50mm/px · z∈[-65,+60]mm · 6 of 26 slices shown (1 of 3)]
[im 1/26]
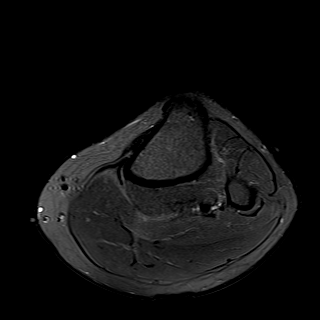
[im 6/26]
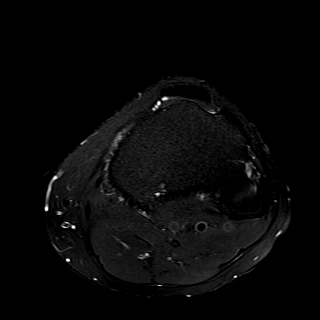
[im 11/26]
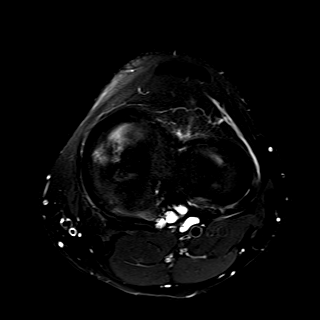
[im 16/26]
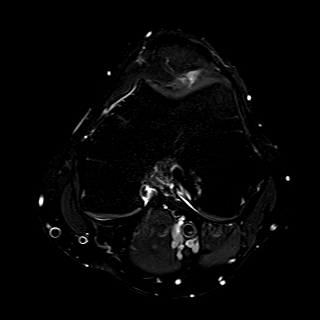
[im 21/26]
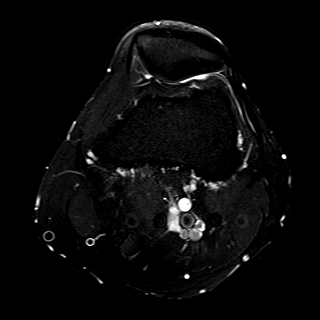
[im 26/26]
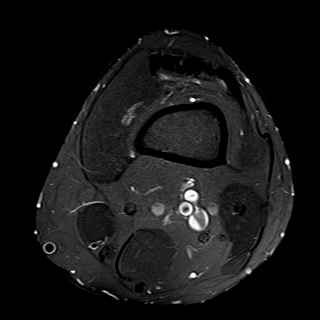

[Series 9: T1 · coronal · left · 4.0mm · 0.47mm/px · 6 of 32 slices shown]
[im 1/32]
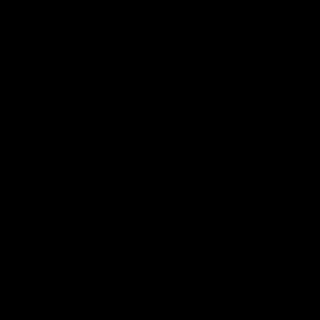
[im 7/32]
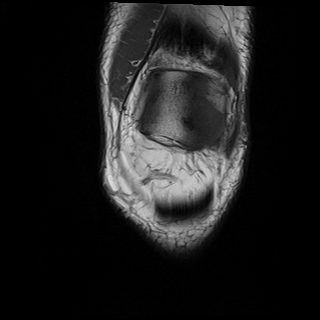
[im 13/32]
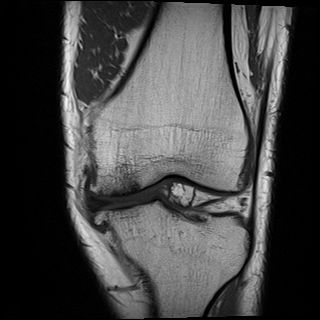
[im 19/32]
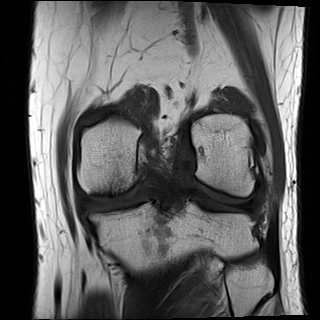
[im 25/32]
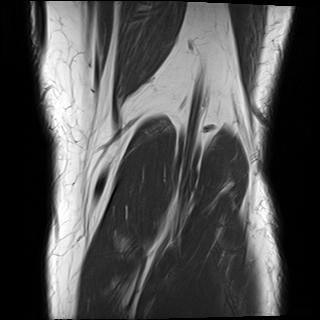
[im 32/32]
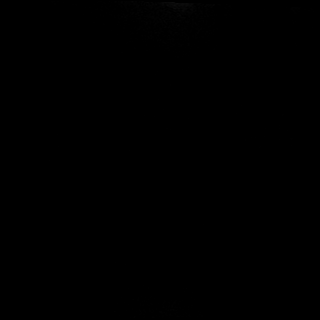

[Series 10: T2 fat-sat · coronal · left · 4.0mm · 0.47mm/px · 6 of 32 slices shown (2 of 3)]
[im 1/32]
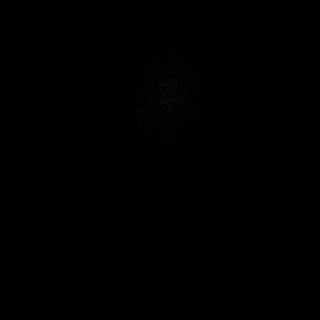
[im 7/32]
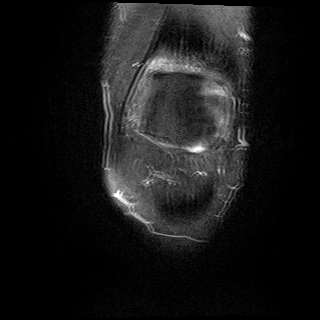
[im 13/32]
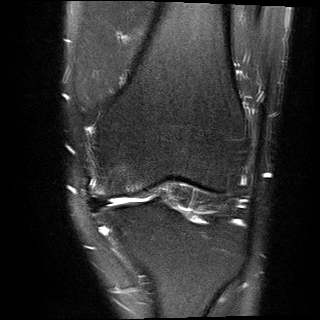
[im 19/32]
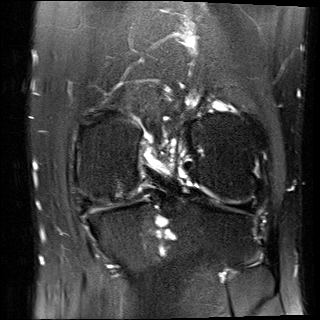
[im 25/32]
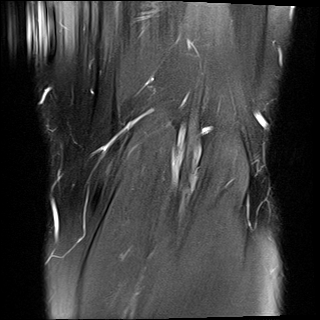
[im 32/32]
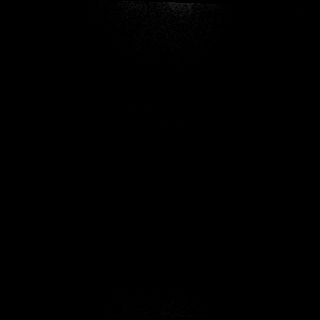

[Series 11: PD fat-sat · coronal · left · 4.0mm · 0.59mm/px · 6 of 32 slices shown (1 of 2)]
[im 1/32]
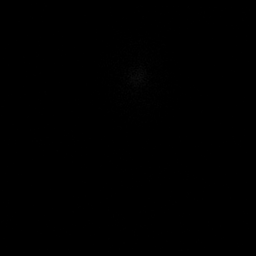
[im 7/32]
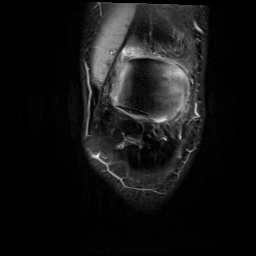
[im 13/32]
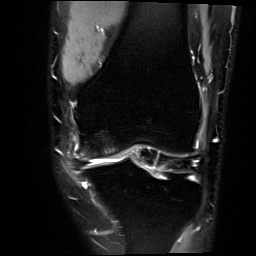
[im 19/32]
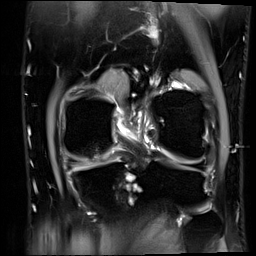
[im 25/32]
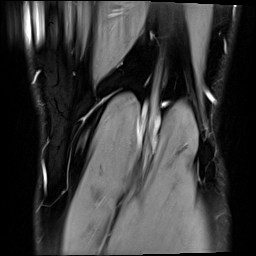
[im 32/32]
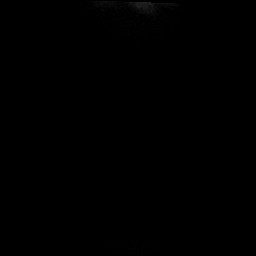

[Series 12: PD fat-sat · sagittal · left · 3.0mm · 0.47mm/px · 7 of 34 slices shown (2 of 2)]
[im 1/34]
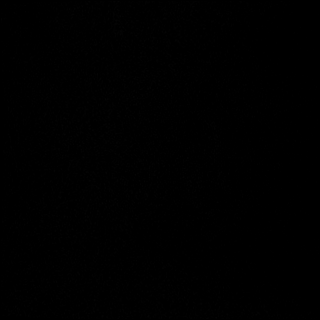
[im 6/34]
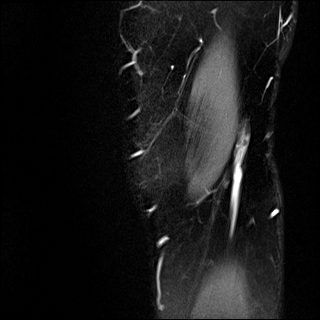
[im 12/34]
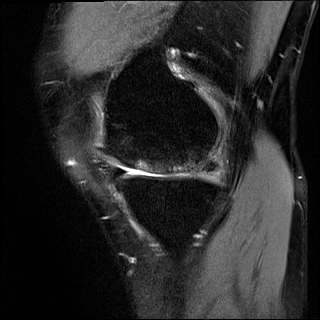
[im 17/34]
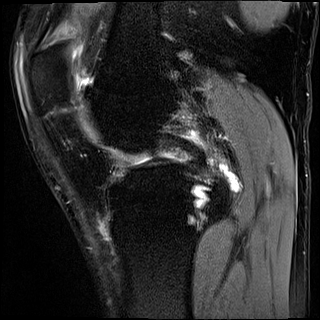
[im 23/34]
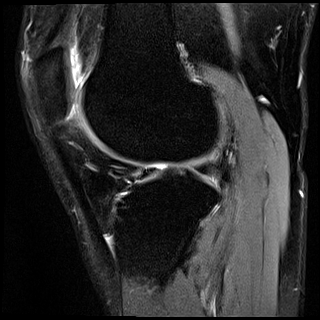
[im 28/34]
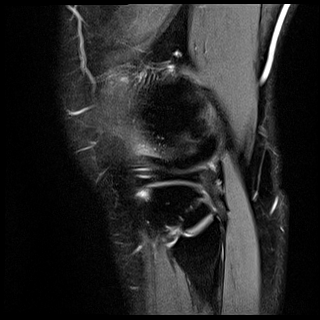
[im 34/34]
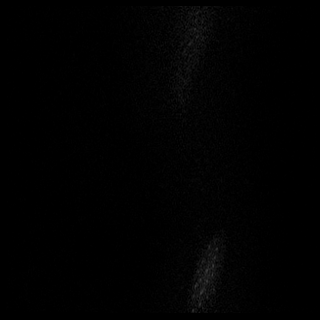

[Series 13: T2 fat-sat · sagittal · left · 3.0mm · 0.47mm/px · 7 of 35 slices shown (3 of 3)]
[im 1/35]
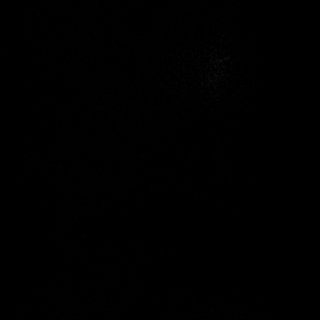
[im 6/35]
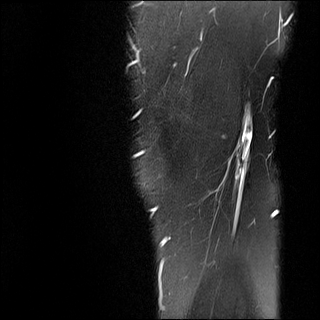
[im 12/35]
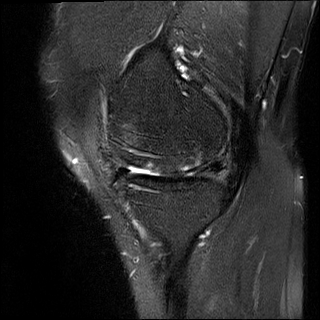
[im 18/35]
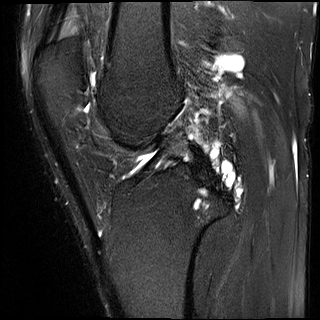
[im 23/35]
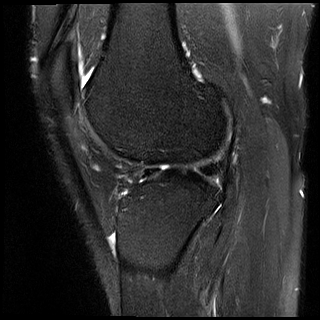
[im 29/35]
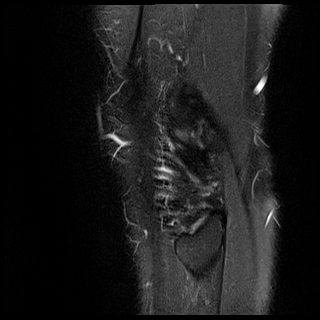
[im 35/35]
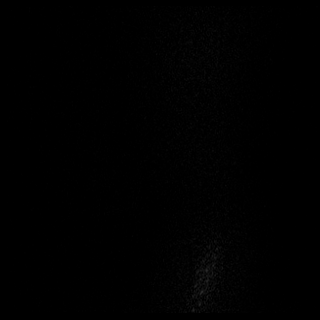

[Series 14: PD · coronal · left · 2.0mm · 0.47mm/px · 2 of 10 slices shown]
[im 1/10]
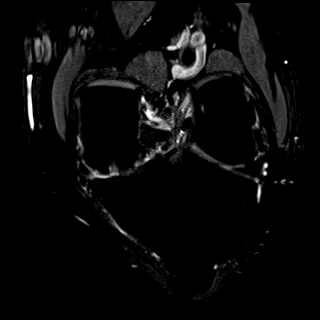
[im 10/10]
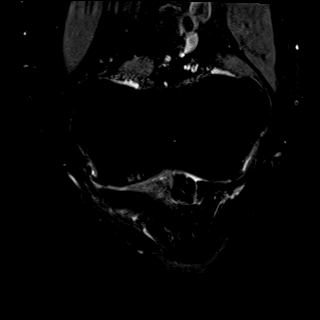

[40 of 40 positions shown; findings below may reference images not displayed]

FINDINGS: Despite efforts by the technologist and patient, mild motion
artifact is present on today's exam and could not be eliminated.
This reduces exam sensitivity and specificity.

MENISCI

Medial meniscus: Extensive degenerative free edge tearing of the
posterior horn and body. There is mild peripheral extrusion of the
meniscus from the joint space, although the meniscal root appears
intact, and no centrally displaced meniscal fragment identified.

Lateral meniscus:  Intact with normal morphology.

LIGAMENTS

Cruciates:  Intact.

Collaterals:  Intact.

CARTILAGE

Patellofemoral:  Preserved.

Medial: Age advanced degenerative changes with diffuse chondral
thinning, subchondral cyst and osteophyte formation.

Lateral: Mild chondral thinning and peripheral osteophyte formation.

MISCELLANEOUS

Joint:  No significant joint effusion.

Popliteal Fossa: Small ganglia centrally in the popliteal fossa,
posterior to the PCL. No typical Baker's cyst.

Extensor Mechanism: Intact. Mild edema within the quadriceps fat
pad.

Bones: No acute or significant extra-articular osseous findings. As
above, age advanced medial compartment degenerative changes. There
are intraosseous ganglia posteriorly in the medial tibial plateau.

Other: No other significant periarticular soft tissue findings.
IMPRESSION: 1. Diffuse degenerative tearing of the body and posterior horn of
the medial meniscus.
2. Underlying age advanced medial compartment osteoarthritic
changes.
3. The lateral meniscus, cruciate and collateral ligaments are
intact.
4. Small ganglia centrally in the popliteal fossa with intraosseous
extension into the medial tibial plateau posteriorly.

## 2022-05-03 ENCOUNTER — Ambulatory Visit
Admission: EM | Admit: 2022-05-03 | Discharge: 2022-05-03 | Disposition: A | Discharge status: Home / Self Care | Payer: Managed Care, Other (non HMO) | Attending: Emergency Medicine | Admitting: Emergency Medicine

## 2022-05-03 DIAGNOSIS — E7800 Pure hypercholesterolemia, unspecified: Secondary | ICD-10-CM | POA: Insufficient documentation

## 2022-05-03 DIAGNOSIS — R6889 Other general symptoms and signs: Secondary | ICD-10-CM | POA: Diagnosis present

## 2022-05-03 DIAGNOSIS — U071 COVID-19: Secondary | ICD-10-CM | POA: Insufficient documentation

## 2022-05-03 DIAGNOSIS — Z79899 Other long term (current) drug therapy: Secondary | ICD-10-CM | POA: Insufficient documentation

## 2022-05-03 DIAGNOSIS — I10 Essential (primary) hypertension: Secondary | ICD-10-CM | POA: Insufficient documentation

## 2022-05-03 DIAGNOSIS — B349 Viral infection, unspecified: Secondary | ICD-10-CM

## 2022-05-03 LAB — POCT RAPID STREP A (OFFICE): Rapid Strep A Screen: NEGATIVE

## 2022-05-03 MED ORDER — OSELTAMIVIR PHOSPHATE 75 MG PO CAPS: 75.0000 mg | ORAL_CAPSULE | Freq: Two times a day (BID) | ORAL | 0 refills | Status: DC

## 2022-05-03 NOTE — ED Provider Notes (Signed)
Daniel Hudson    CSN: [id number] Arrival date & time: 05/03/22  1308      History   Chief Complaint Chief Complaint  Patient presents with   Fever    HPI Daniel Hudson is a 42 y.o. male.  Patient presents with fever, body aches, sore throat, congestion, cough x 1 day.  Tmax 101.  Treatment at home with Mucinex cold medication.  He denies rash, shortness of breath, vomiting, diarrhea, or other symptoms.  His medical history includes hypertension.    The history is provided by the patient and medical records.    Past Medical History:  Diagnosis Date   High cholesterol    Hypertension     There are no problems to display for this patient.   Past Surgical History:  Procedure Laterality Date   ESOPHAGOGASTRODUODENOSCOPY (EGD) WITH PROPOFOL N/A 01/30/2015   Procedure: ESOPHAGOGASTRODUODENOSCOPY (EGD) WITH PROPOFOL;  Surgeon: Lollie Sails, MD;  Location: [facility] ENDOSCOPY;  Service: Endoscopy;  Laterality: N/A;       Home Medications    Prior to Admission medications   Medication Sig Start Date End Date Taking? Authorizing Provider  oseltamivir (TAMIFLU) 75 MG capsule Take 1 capsule (75 mg total) by mouth every 12 (twelve) hours. 05/03/22  Yes Sharion Balloon, NP  escitalopram (LEXAPRO) 10 MG tablet Take 10 mg by mouth daily.    [provider]  propranolol (INDERAL) 40 MG tablet Take 40 mg by mouth 3 (three) times daily.    [provider]  rosuvastatin (CRESTOR) 20 MG tablet Take 20 mg by mouth at bedtime. 09/12/21   [provider]  sildenafil (REVATIO) 20 MG tablet Take one tablet by mouth one hour prior to sex as needed. Do not take more than one dose every 24 hours. 03/30/17   [provider]    Family History No family history on file.  Social History Social History   Tobacco Use   Smoking status: Former    Types: Cigarettes   Smokeless tobacco: Never  Substance Use Topics   Alcohol use: No   Drug use: No      Allergies   Patient has no known allergies.   Review of Systems Review of Systems  Constitutional:  Positive for fever. Negative for chills.  HENT:  Positive for congestion and sore throat. Negative for ear pain.   Respiratory:  Positive for cough. Negative for shortness of breath.   Cardiovascular:  Negative for chest pain and palpitations.  Gastrointestinal:  Negative for abdominal pain, diarrhea and vomiting.  Skin:  Negative for rash.  All other systems reviewed and are negative.    Physical Exam Triage Vital Signs ED Triage Vitals  Enc Vitals Group     BP 05/03/22 1322 119/83     Pulse Rate 05/03/22 1322 96     Resp 05/03/22 1322 18     Temp 05/03/22 1322 98.7 F (37.1 C)     Temp src --      SpO2 05/03/22 1322 96 %     Weight --      Height --      Head Circumference --      Peak Flow --      Pain Score 05/03/22 1332 6     Pain Loc --      Pain Edu? --      Excl. in [place]? --    No data found.  Updated Vital Signs BP 119/83   Pulse 96  Temp 98.7 F (37.1 C)   Resp 18   SpO2 96%   Visual Acuity Right Eye Distance:   Left Eye Distance:   Bilateral Distance:    Right Eye Near:   Left Eye Near:    Bilateral Near:     Physical Exam Vitals and nursing note reviewed.  Constitutional:      General: He is not in acute distress.    Appearance: Normal appearance. He is well-developed. He is not ill-appearing.  HENT:     Right Ear: Tympanic membrane normal.     Left Ear: Tympanic membrane normal.     Nose: Nose normal.     Mouth/Throat:     Mouth: Mucous membranes are moist.     Pharynx: Oropharynx is clear.  Cardiovascular:     Rate and Rhythm: Normal rate and regular rhythm.     Heart sounds: Normal heart sounds.  Pulmonary:     Effort: Pulmonary effort is normal. No respiratory distress.     Breath sounds: Normal breath sounds.  Musculoskeletal:     Cervical back: Neck supple.  Skin:    General: Skin is warm and dry.  Neurological:      Mental Status: He is alert.  Psychiatric:        Mood and Affect: Mood normal.        Behavior: Behavior normal.      UC Treatments / Results  Labs (all labs ordered are listed, but only abnormal results are displayed) Labs Reviewed  SARS CORONAVIRUS 2 (TAT 6-24 HRS)  POCT RAPID STREP A (OFFICE)    EKG   Radiology No results found.  Procedures Procedures (including critical care time)  Medications Ordered in UC Medications - No data to display  Initial Impression / Assessment and Plan / UC Course  I have reviewed the triage vital signs and the nursing notes.  Pertinent labs & imaging results that were available during my care of the patient were reviewed by me and considered in my medical decision making (see chart for details).    Viral illness, Flu-like symptoms.  Afebrile, VSS.  Treating with Tamiflu.  COVID pending. Instructed patient to stop Tamiflu if COVID test is positive.  Recommend treatment with molnupiravir if COVID positive.  Discussed symptomatic treatment including Tylenol, rest, hydration.  Instructed patient to follow up with PCP if symptoms are not improving.  He agrees to plan of care.   Final Clinical Impressions(s) / UC Diagnoses   Final diagnoses:  Viral illness  Flu-like symptoms     Discharge Instructions      Take the Tamiflu as directed.    Your COVID test is pending.  If the COVID test is positive, stop the Tamiflu.   Take Tylenol as needed for fever or discomfort.  Rest and keep yourself hydrated.    Follow-up with your primary care provider if your symptoms are not improving.         ED Prescriptions     Medication Sig Dispense Auth. Provider   oseltamivir (TAMIFLU) 75 MG capsule Take 1 capsule (75 mg total) by mouth every 12 (twelve) hours. 10 capsule Sharion Balloon, NP      PDMP not reviewed this encounter.   Sharion Balloon, NP 05/03/22 1428

## 2022-05-03 NOTE — ED Triage Notes (Addendum)
Patient to Urgent Care with complaints of generalized body aches, fever, sore throat, and nasal congestion.  Reports symptoms started yesterday morning.   Max temp 101. Has been taking mucinex.

## 2022-05-03 NOTE — Discharge Instructions (Addendum)
Take the Tamiflu as directed.    Your COVID test is pending.  If the COVID test is positive, stop the Tamiflu.   Take Tylenol as needed for fever or discomfort.  Rest and keep yourself hydrated.    Follow-up with your primary care provider if your symptoms are not improving.

## 2022-05-04 ENCOUNTER — Telehealth: Payer: Self-pay | Admitting: Emergency Medicine

## 2022-05-04 LAB — SARS CORONAVIRUS 2 (TAT 6-24 HRS): SARS Coronavirus 2: POSITIVE — AB

## 2022-05-04 MED ORDER — IBUPROFEN 600 MG PO TABS: 600.0000 mg | ORAL_TABLET | Freq: Four times a day (QID) | ORAL | 0 refills | Status: DC | PRN

## 2022-05-04 NOTE — Telephone Encounter (Signed)
Patient returned telephone call.  Discussed positive COVID result and treatment options.  He declines treatment with COVID antiviral.  Instructed him to stop Tamiflu.  Per patient request, treating with ibuprofen.  Discussed other symptomatic treatment including rest and hydration.  Instructed patient to follow-up with PCP if symptoms are not improving.  ED precautions discussed.  Patient agrees to plan of care.

## 2022-05-04 NOTE — Telephone Encounter (Signed)
Telephone call to patient to discuss positive COVID test.  No answer, left message to call back.

## 2022-05-17 ENCOUNTER — Other Ambulatory Visit: Payer: Self-pay

## 2022-05-21 ENCOUNTER — Other Ambulatory Visit: Payer: Self-pay

## 2022-05-21 MED ORDER — ROSUVASTATIN CALCIUM 20 MG PO TABS: 20.0000 mg | ORAL_TABLET | Freq: Every day | ORAL | 0 refills | Status: DC

## 2022-05-21 MED ORDER — RAMIPRIL 5 MG PO CAPS: 5.0000 mg | ORAL_CAPSULE | Freq: Every day | ORAL | 0 refills | Status: DC

## 2022-05-21 MED ORDER — METFORMIN HCL 850 MG PO TABS: 850.0000 mg | ORAL_TABLET | Freq: Two times a day (BID) | ORAL | 0 refills | Status: DC

## 2022-06-10 ENCOUNTER — Other Ambulatory Visit: Payer: Self-pay | Admitting: Internal Medicine

## 2022-06-21 ENCOUNTER — Ambulatory Visit: Payer: Managed Care, Other (non HMO) | Admitting: Internal Medicine

## 2022-07-14 ENCOUNTER — Other Ambulatory Visit: Payer: Self-pay | Admitting: Internal Medicine

## 2022-07-23 ENCOUNTER — Other Ambulatory Visit: Payer: Self-pay | Admitting: Internal Medicine

## 2022-12-10 ENCOUNTER — Other Ambulatory Visit: Payer: Self-pay | Admitting: Internal Medicine

## 2023-01-06 ENCOUNTER — Other Ambulatory Visit: Payer: Self-pay | Admitting: Internal Medicine

## 2023-01-28 ENCOUNTER — Other Ambulatory Visit: Payer: Self-pay | Admitting: Internal Medicine

## 2023-02-22 ENCOUNTER — Encounter (HOSPITAL_COMMUNITY): Payer: Self-pay | Admitting: Emergency Medicine

## 2023-02-22 ENCOUNTER — Other Ambulatory Visit: Payer: Self-pay

## 2023-02-22 ENCOUNTER — Emergency Department (HOSPITAL_COMMUNITY)
Admission: EM | Admit: 2023-02-22 | Discharge: 2023-02-23 | Discharge status: Against Medical Advice | Payer: Managed Care, Other (non HMO) | Attending: Emergency Medicine | Admitting: Emergency Medicine

## 2023-02-22 ENCOUNTER — Emergency Department (HOSPITAL_COMMUNITY): Payer: Managed Care, Other (non HMO)

## 2023-02-22 DIAGNOSIS — Z5321 Procedure and treatment not carried out due to patient leaving prior to being seen by health care provider: Secondary | ICD-10-CM | POA: Diagnosis not present

## 2023-02-22 DIAGNOSIS — M79602 Pain in left arm: Secondary | ICD-10-CM | POA: Insufficient documentation

## 2023-02-22 DIAGNOSIS — M542 Cervicalgia: Secondary | ICD-10-CM | POA: Insufficient documentation

## 2023-02-22 DIAGNOSIS — R519 Headache, unspecified: Secondary | ICD-10-CM | POA: Insufficient documentation

## 2023-02-22 LAB — BASIC METABOLIC PANEL
Anion gap: 9 (ref 5–15)
BUN: 13 mg/dL (ref 6–20)
CO2: 25 mmol/L (ref 22–32)
Calcium: 9.5 mg/dL (ref 8.9–10.3)
Chloride: 104 mmol/L (ref 98–111)
Creatinine, Ser: 0.86 mg/dL (ref 0.61–1.24)
GFR, Estimated: >60 mL/min (ref >60)
Glucose, Bld: 113 mg/dL — ABNORMAL HIGH (ref 70–99)
Potassium: 3.8 mmol/L (ref 3.5–5.1)
Sodium: 138 mmol/L (ref 135–145)

## 2023-02-22 LAB — CBC
HCT: 44.1 % (ref 39.0–52.0)
Hemoglobin: 14.8 g/dL (ref 13.0–17.0)
MCH: 25.7 pg — ABNORMAL LOW (ref 26.0–34.0)
MCHC: 33.6 g/dL (ref 30.0–36.0)
MCV: 76.6 fL — ABNORMAL LOW (ref 80.0–100.0)
Platelets: 239 10*3/uL (ref 150–400)
RBC: 5.76 MIL/uL (ref 4.22–5.81)
RDW: 14.2 % (ref 11.5–15.5)
WBC: 4.5 10*3/uL (ref 4.0–10.5)
nRBC: 0 % (ref 0.0–0.2)

## 2023-02-22 LAB — TROPONIN I (HIGH SENSITIVITY): Troponin I (High Sensitivity): 3 ng/L (ref <18)

## 2023-02-22 NOTE — ED Triage Notes (Signed)
Pt here POV for L arm pain, head and neck pain that started last night. L sided CP. States it woke him up from his sleep. Reports minimal relief with Ibuprofen.

## 2023-02-23 LAB — TROPONIN I (HIGH SENSITIVITY): Troponin I (High Sensitivity): 3 ng/L (ref <18)

## 2023-02-23 NOTE — ED Notes (Signed)
Pt states that he needs to leave due to wife needing to go to work.

## 2023-03-10 ENCOUNTER — Other Ambulatory Visit: Payer: Managed Care, Other (non HMO)

## 2023-03-10 ENCOUNTER — Other Ambulatory Visit: Payer: Self-pay | Admitting: Internal Medicine

## 2023-03-11 LAB — LIPID PANEL W/O CHOL/HDL RATIO
Cholesterol, Total: 150 mg/dL (ref 100–199)
HDL: 44 mg/dL (ref >39)
LDL Chol Calc (NIH): 81 mg/dL (ref 0–99)
Triglycerides: 141 mg/dL (ref 0–149)
VLDL Cholesterol Cal: 25 mg/dL (ref 5–40)

## 2023-03-11 LAB — VITAMIN D 25 HYDROXY (VIT D DEFICIENCY, FRACTURES): Vit D, 25-Hydroxy: 89.8 ng/mL (ref 30.0–100.0)

## 2023-03-11 LAB — HGB A1C W/O EAG: Hgb A1c MFr Bld: 6.4 % — ABNORMAL HIGH (ref 4.8–5.6)

## 2023-03-16 ENCOUNTER — Other Ambulatory Visit: Payer: Self-pay | Admitting: Internal Medicine

## 2023-03-17 ENCOUNTER — Ambulatory Visit: Payer: Managed Care, Other (non HMO) | Admitting: Internal Medicine

## 2023-05-19 ENCOUNTER — Encounter: Payer: Self-pay | Admitting: Cardiovascular Disease

## 2023-05-19 ENCOUNTER — Ambulatory Visit: Payer: Self-pay | Admitting: Cardiovascular Disease

## 2023-05-19 VITALS — BP 118/70 | HR 73 | Ht 70.0 in | Wt 184.6 lb

## 2023-05-19 DIAGNOSIS — E119 Type 2 diabetes mellitus without complications: Secondary | ICD-10-CM

## 2023-05-19 DIAGNOSIS — R0602 Shortness of breath: Secondary | ICD-10-CM

## 2023-05-19 DIAGNOSIS — R9431 Abnormal electrocardiogram [ECG] [EKG]: Secondary | ICD-10-CM

## 2023-05-19 DIAGNOSIS — R0789 Other chest pain: Secondary | ICD-10-CM

## 2023-05-19 DIAGNOSIS — I10 Essential (primary) hypertension: Secondary | ICD-10-CM

## 2023-05-19 MED ORDER — ASPIRIN 81 MG PO TBEC: 81.0000 mg | DELAYED_RELEASE_TABLET | Freq: Every day | ORAL | 12 refills | Status: DC

## 2023-05-19 MED ORDER — ISOSORBIDE MONONITRATE ER 30 MG PO TB24: 30.0000 mg | ORAL_TABLET | Freq: Every day | ORAL | 1 refills | Status: DC

## 2023-05-19 MED ORDER — RANOLAZINE ER 500 MG PO TB12: 500.0000 mg | ORAL_TABLET | Freq: Two times a day (BID) | ORAL | 2 refills | Status: DC

## 2023-05-19 NOTE — Progress Notes (Addendum)
 Cardiology Office Note   Date:  07/24/2023   ID:  Daniel Hudson, DOB 1980/06/12, MRN [id number]  PCP:  Shari Daughters, MD  Cardiologist:  Debborah Fairly, MD      History of Present Illness: Daniel Hudson is a 43 y.o. male who presents for  Chief Complaint  Patient presents with  . Chest Pain    This is a 43 year old [place] American male who presented to see Dr.Tejansie because of chest pain.  Patient states he has less precordial pressure type chest pain radiating to the left arm and shoulder associated with shortness of breath.  This chest pain is pressure type pains been going on for a week associated with diaphoresis.  He had a workup 3 to 4 years ago with nuclear stress test and an echo at and at that time everything was unremarkable.  He had abnormal EKG thus I was asked to evaluate the patient.  Chest Pain  This is a new problem. The current episode started 1 to 4 weeks ago. The onset quality is sudden. The problem occurs 2 to 4 times per day. The problem has been waxing and waning. The pain is at a severity of 5/10. The quality of the pain is described as tightness. Pertinent negatives include no nausea, near-syncope, orthopnea, palpitations, PND, syncope or weakness.      Past Medical History:  Diagnosis Date  . High cholesterol   . Hypertension      Past Surgical History:  Procedure Laterality Date  . ESOPHAGOGASTRODUODENOSCOPY (EGD) WITH PROPOFOL N/A 01/30/2015   Procedure: ESOPHAGOGASTRODUODENOSCOPY (EGD) WITH PROPOFOL;  Surgeon: Deveron Fly, MD;  Location: [facility] ENDOSCOPY;  Service: Endoscopy;  Laterality: N/A;     Current Outpatient Medications  Medication Sig Dispense Refill  . aspirin EC 81 MG tablet Take 1 tablet (81 mg total) by mouth daily. Swallow whole. 30 tablet 12  . ranolazine (RANEXA) 500 MG 12 hr tablet Take 1 tablet (500 mg total) by mouth 2 (two) times daily. 60 tablet 2  . CONTOUR NEXT TEST test strip TEST TWICE DAILY 200 strip 3  .  escitalopram (LEXAPRO) 10 MG tablet TAKE 1 TABLET BY MOUTH IN THE  MORNING 30 tablet 0  . ibuprofen (ADVIL) 600 MG tablet Take 1 tablet (600 mg total) by mouth every 6 (six) hours as needed. 30 tablet 0  . isosorbide mononitrate (IMDUR) 30 MG 24 hr tablet TAKE 1 TABLET BY MOUTH EVERY DAY 90 tablet 1  . metFORMIN (GLUCOPHAGE) 850 MG tablet Take 1 tablet (850 mg total) by mouth 2 (two) times daily with a meal. 14 tablet 0  . propranolol (INDERAL) 40 MG tablet Take 40 mg by mouth 3 (three) times daily.    . ramipril (ALTACE) 5 MG capsule Take 1 capsule (5 mg total) by mouth daily. 7 capsule 0  . rosuvastatin (CRESTOR) 20 MG tablet Take 1 tablet (20 mg total) by mouth at bedtime. 7 tablet 0   No current facility-administered medications for this visit.    Allergies:   Patient has no known allergies.    Social History:   reports that he has quit smoking. His smoking use included cigarettes. He has never used smokeless tobacco. He reports that he does not drink alcohol and does not use drugs.   Family History:  family history is not on file.    ROS:     Review of Systems  Constitutional: Negative.   HENT: Negative.    Eyes: Negative.   Respiratory:  Negative.    Cardiovascular:  Positive for chest pain. Negative for palpitations, orthopnea, syncope, PND and near-syncope.  Gastrointestinal: Negative.  Negative for nausea.  Genitourinary: Negative.   Musculoskeletal: Negative.   Skin: Negative.   Neurological: Negative.  Negative for weakness.  Endo/Heme/Allergies: Negative.   Psychiatric/Behavioral: Negative.    All other systems reviewed and are negative.     All other systems are reviewed and negative.    PHYSICAL EXAM: VS:  BP 118/70   Pulse 73   Ht 5\' 10"  (1.778 m)   Wt 184 lb 9.6 oz (83.7 kg)   SpO2 98%   BMI 26.49 kg/m  , BMI Body mass index is 26.49 kg/m. Last weight:  Wt Readings from Last 3 Encounters:  05/19/23 184 lb 9.6 oz (83.7 kg)  02/22/23 180 lb (81.6 kg)   05/25/17 215 lb (97.5 kg)     Physical Exam Vitals reviewed.  Constitutional:      Appearance: Normal appearance. He is normal weight.  HENT:     Head: Normocephalic.     Nose: Nose normal.     Mouth/Throat:     Mouth: Mucous membranes are moist.  Eyes:     Pupils: Pupils are equal, round, and reactive to light.  Cardiovascular:     Rate and Rhythm: Normal rate and regular rhythm.     Pulses: Normal pulses.     Heart sounds: Normal heart sounds.  Pulmonary:     Effort: Pulmonary effort is normal.  Abdominal:     General: Abdomen is flat. Bowel sounds are normal.  Musculoskeletal:        General: Normal range of motion.     Cervical back: Normal range of motion.  Skin:    General: Skin is warm.  Neurological:     General: No focal deficit present.     Mental Status: He is alert.  Psychiatric:        Mood and Affect: Mood normal.      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 Patient is unable to walk.    2. SOB (shortness of breath)  R06.02 PCV ECHOCARDIOGRAM COMPLETE    MYOCARDIAL PERFUSION IMAGING    aspirin EC 81 MG tablet     ranolazine (RANEXA) 500 MG 12 hr tablet    DISCONTINUED: isosorbide mononitrate (IMDUR) 30 MG 24 hr tablet   Will add aspirin 81 mg, Ranexa 500 mg twice daily and Imdur 30 mg once a day.  Patient appears to  have anginal type of chest pain and evaluated CAD.    3. Primary hypertension  I10 PCV ECHOCARDIOGRAM COMPLETE    MYOCARDIAL PERFUSION IMAGING    aspirin EC 81 MG tablet    ranolazine (RANEXA) 500 MG 12 hr tablet    DISCONTINUED: isosorbide mononitrate (IMDUR) 30 MG 24 hr tablet   Stable.    4. Abnormal EKG  R94.31 PCV ECHOCARDIOGRAM COMPLETE    MYOCARDIAL PERFUSION IMAGING    aspirin EC 81 MG tablet    ranolazine (RANEXA) 500 MG 12 hr tablet    DISCONTINUED: isosorbide mononitrate (IMDUR) 30 MG 24 hr tablet   Abnormal EKG with old inferior and lateral wall Q waves suggestive of MI.    5. 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 Patient is unable to walk.   Relevant Medications   aspirin EC 81 MG tablet   ranolazine (RANEXA) 500 MG 12 hr tablet   Other Relevant Orders   PCV ECHOCARDIOGRAM COMPLETE   MYOCARDIAL PERFUSION IMAGING     SOB (shortness of breath)       Will add aspirin 81 mg, Ranexa 500 mg twice daily and Imdur 30 mg once a day.  Patient appears to have anginal type of chest pain and evaluated CAD.   Relevant Medications   aspirin EC 81 MG tablet   ranolazine (RANEXA) 500 MG 12 hr tablet   Other Relevant Orders   PCV ECHOCARDIOGRAM COMPLETE   MYOCARDIAL PERFUSION IMAGING      Primary hypertension       Stable.   Relevant Medications   aspirin EC 81 MG tablet   ranolazine (RANEXA) 500 MG 12 hr tablet   Other Relevant Orders   PCV ECHOCARDIOGRAM COMPLETE   MYOCARDIAL PERFUSION IMAGING     Abnormal EKG       Abnormal EKG with old inferior and lateral wall Q waves suggestive of MI.   Relevant Medications   aspirin EC 81 MG tablet   ranolazine (RANEXA) 500 MG 12 hr tablet   Other Relevant Orders   PCV ECHOCARDIOGRAM COMPLETE   MYOCARDIAL PERFUSION IMAGING     Type 2 diabetes mellitus without complication, without long-term current use of insulin (HCC)       Relevant Medications   aspirin EC 81 MG tablet   ranolazine (RANEXA) 500 MG 12 hr tablet   Other Relevant Orders   PCV ECHOCARDIOGRAM COMPLETE   MYOCARDIAL PERFUSION IMAGING          Disposition:   Return in about 4 weeks (around 06/16/2023) for echo, stress test and f/u.    Total time spent: 50 minutes  Signed,  Debborah Fairly, MD  07/24/2023 10:38 AM    [facility]

## 2023-05-22 ENCOUNTER — Encounter: Payer: Self-pay | Admitting: Cardiovascular Disease

## 2023-06-10 ENCOUNTER — Other Ambulatory Visit: Payer: Self-pay

## 2023-06-10 MED ORDER — ROSUVASTATIN CALCIUM 20 MG PO TABS: 20.0000 mg | ORAL_TABLET | Freq: Every day | ORAL | 0 refills | Status: DC

## 2023-06-10 MED ORDER — RAMIPRIL 5 MG PO CAPS: 5.0000 mg | ORAL_CAPSULE | Freq: Every day | ORAL | 0 refills | Status: DC

## 2023-06-10 MED ORDER — METFORMIN HCL 850 MG PO TABS: 850.0000 mg | ORAL_TABLET | Freq: Two times a day (BID) | ORAL | 0 refills | Status: DC

## 2023-06-11 ENCOUNTER — Other Ambulatory Visit: Payer: Self-pay | Admitting: Cardiovascular Disease

## 2023-06-11 DIAGNOSIS — E119 Type 2 diabetes mellitus without complications: Secondary | ICD-10-CM

## 2023-06-11 DIAGNOSIS — R0602 Shortness of breath: Secondary | ICD-10-CM

## 2023-06-11 DIAGNOSIS — R0789 Other chest pain: Secondary | ICD-10-CM

## 2023-06-11 DIAGNOSIS — R9431 Abnormal electrocardiogram [ECG] [EKG]: Secondary | ICD-10-CM

## 2023-06-11 DIAGNOSIS — I10 Essential (primary) hypertension: Secondary | ICD-10-CM

## 2023-06-19 ENCOUNTER — Encounter

## 2023-07-24 ENCOUNTER — Encounter

## 2023-08-05 ENCOUNTER — Ambulatory Visit: Admitting: Internal Medicine

## 2023-08-05 VITALS — BP 108/60 | HR 81 | Temp 98.3°F | Ht 70.0 in | Wt 182.0 lb

## 2023-08-05 DIAGNOSIS — Z0130 Encounter for examination of blood pressure without abnormal findings: Secondary | ICD-10-CM

## 2023-08-05 DIAGNOSIS — E782 Mixed hyperlipidemia: Secondary | ICD-10-CM

## 2023-08-05 DIAGNOSIS — E119 Type 2 diabetes mellitus without complications: Secondary | ICD-10-CM

## 2023-08-05 LAB — POCT CBG (FASTING - GLUCOSE)-MANUAL ENTRY: Glucose Fasting, POC: 198 mg/dL — AB (ref 70–99)

## 2023-08-05 MED ORDER — RAMIPRIL 5 MG PO CAPS: 5.0000 mg | ORAL_CAPSULE | Freq: Every day | ORAL | 0 refills | Status: DC

## 2023-08-05 MED ORDER — METFORMIN HCL 500 MG PO TABS: 500.0000 mg | ORAL_TABLET | Freq: Two times a day (BID) | ORAL | 1 refills | Status: DC

## 2023-08-05 MED ORDER — ROSUVASTATIN CALCIUM 20 MG PO TABS: 20.0000 mg | ORAL_TABLET | Freq: Every day | ORAL | 0 refills | Status: DC

## 2023-08-05 NOTE — Progress Notes (Signed)
 Established Patient Office Visit  Subjective:  Patient ID: Daniel Hudson, male    DOB: [birth year]  Age: 43 y.o. MRN: [id number]  Chief Complaint  Patient presents with   Medication Refill    Medication Refills     No new complaints, here for lab review and medication refills. Labs reviewed and notable for well controlled diabetes, A1c at target, lipids at target with unremarkable cmp. Admits to several hypoglycemic episodes and home bg readings have been at target now that he'Vick Filter on a strict low carb diet.    No other concerns at this time.   Past Medical History:  Diagnosis Date   High cholesterol    Hypertension     Past Surgical History:  Procedure Laterality Date   ESOPHAGOGASTRODUODENOSCOPY (EGD) WITH PROPOFOL  N/A 01/30/2015   Procedure: ESOPHAGOGASTRODUODENOSCOPY (EGD) WITH PROPOFOL ;  Surgeon: Deveron Fly, MD;  Location: [facility] ENDOSCOPY;  Service: Endoscopy;  Laterality: N/A;    Social History   Socioeconomic History   Marital status: Married    Spouse name: Not on file   Number of children: Not on file   Years of education: Not on file   Highest education level: Not on file  Occupational History   Not on file  Tobacco Use   Smoking status: Former    Types: Cigarettes   Smokeless tobacco: Never  Substance and Sexual Activity   Alcohol use: No   Drug use: No   Sexual activity: Not on file  Other Topics Concern   Not on file  Social History Narrative   Not on file   Social Drivers of Health   Financial Resource Strain: Not on file  Food Insecurity: Not on file  Transportation Needs: Not on file  Physical Activity: Not on file  Stress: Not on file  Social Connections: Not on file  Intimate Partner Violence: Not on file    No family history on file.  No Known Allergies  Outpatient Medications Prior to Visit  Medication Sig   escitalopram (LEXAPRO) 10 MG tablet TAKE 1 TABLET BY MOUTH IN THE  MORNING   propranolol (INDERAL) 40 MG tablet Take 40  mg by mouth 3 (three) times daily.   [DISCONTINUED] metFORMIN  (GLUCOPHAGE ) 850 MG tablet Take 1 tablet (850 mg total) by mouth 2 (two) times daily with a meal.   [DISCONTINUED] ramipril  (ALTACE ) 5 MG capsule Take 1 capsule (5 mg total) by mouth daily.   [DISCONTINUED] rosuvastatin  (CRESTOR ) 20 MG tablet Take 1 tablet (20 mg total) by mouth at bedtime.   CONTOUR NEXT TEST test strip TEST TWICE DAILY   [DISCONTINUED] aspirin  EC 81 MG tablet Take 1 tablet (81 mg total) by mouth daily. Swallow whole. (Patient not taking: Reported on 08/05/2023)   [DISCONTINUED] ibuprofen  (ADVIL ) 600 MG tablet Take 1 tablet (600 mg total) by mouth every 6 (six) hours as needed. (Patient not taking: Reported on 08/05/2023)   [DISCONTINUED] isosorbide  mononitrate (IMDUR ) 30 MG 24 hr tablet TAKE 1 TABLET BY MOUTH EVERY DAY   [DISCONTINUED] ranolazine  (RANEXA ) 500 MG 12 hr tablet Take 1 tablet (500 mg total) by mouth 2 (two) times daily. (Patient not taking: Reported on 08/05/2023)   No facility-administered medications prior to visit.    Review of Systems  Constitutional:  Positive for weight loss (2 lbs). Negative for malaise/fatigue.  Respiratory: Negative.    Cardiovascular:  Positive for chest pain.  Gastrointestinal: Negative.   Genitourinary: Negative.   Musculoskeletal: Negative.   Neurological: Negative.  Objective:   BP 108/60   Pulse 81   Temp 98.3 F (36.8 C) (Tympanic)   Ht 5\' 10"  (1.778 m)   Wt 182 lb (82.6 kg)   SpO2 96%   BMI 26.11 kg/m   Vitals:   08/05/23 1456  BP: 108/60  Pulse: 81  Temp: 98.3 F (36.8 C)  Height: 5\' 10"  (1.778 m)  Weight: 182 lb (82.6 kg)  SpO2: 96%  TempSrc: Tympanic  BMI (Calculated): 26.11    Physical Exam Vitals reviewed.  Constitutional:      Appearance: Normal appearance. He is normal weight.  HENT:     Head: Normocephalic.     Left Ear: There is no impacted cerumen.     Nose: Nose normal.     Mouth/Throat:     Mouth: Mucous membranes are  moist.     Pharynx: No posterior oropharyngeal erythema.  Eyes:     Extraocular Movements: Extraocular movements intact.     Pupils: Pupils are equal, round, and reactive to light.  Cardiovascular:     Rate and Rhythm: Regular rhythm.     Chest Wall: PMI is not displaced.     Pulses: Normal pulses.     Heart sounds: Normal heart sounds. No murmur heard. Pulmonary:     Effort: Pulmonary effort is normal.     Breath sounds: Normal air entry. No rhonchi or rales.  Abdominal:     General: Abdomen is flat. Bowel sounds are normal. There is no distension.     Palpations: Abdomen is soft. There is no hepatomegaly, splenomegaly or mass.     Tenderness: There is no abdominal tenderness.  Musculoskeletal:        General: Normal range of motion.     Cervical back: Normal range of motion and neck supple.     Right lower leg: No edema.     Left lower leg: No edema.  Skin:    General: Skin is warm and dry.  Neurological:     General: No focal deficit present.     Mental Status: He is alert and oriented to person, place, and time.     Cranial Nerves: No cranial nerve deficit.     Motor: No weakness.  Psychiatric:        Mood and Affect: Mood normal.        Behavior: Behavior normal.      Results for orders placed or performed in visit on 08/05/23  POCT CBG (Fasting - Glucose)  Result Value Ref Range   Glucose Fasting, POC 198 (A) 70 - 99 mg/dL    Recent Results (from the past 2160 hours)  POCT CBG (Fasting - Glucose)     Status: Abnormal   Collection Time: 08/05/23  3:01 PM  Result Value Ref Range   Glucose Fasting, POC 198 (A) 70 - 99 mg/dL      Assessment & Plan:  As per problem list decrease Metformin . Problem List Items Addressed This Visit   None Visit Diagnoses       Type 2 diabetes mellitus without complication, without long-term current use of insulin (HCC)    -  Primary   Relevant Medications   metFORMIN  (GLUCOPHAGE ) 500 MG tablet   ramipril  (ALTACE ) 5 MG capsule    rosuvastatin  (CRESTOR ) 20 MG tablet   Other Relevant Orders   POCT CBG (Fasting - Glucose) (Completed)   Hemoglobin A1c     Controlled type 2 diabetes mellitus without complication, without long-term current use of insulin (  HCC)       Relevant Medications   metFORMIN  (GLUCOPHAGE ) 500 MG tablet   ramipril  (ALTACE ) 5 MG capsule   rosuvastatin  (CRESTOR ) 20 MG tablet     Mixed hyperlipidemia       Relevant Medications   ramipril  (ALTACE ) 5 MG capsule   rosuvastatin  (CRESTOR ) 20 MG tablet   Other Relevant Orders   Lipid panel       Return in about 3 months (around 11/04/2023) for fu with labs prior.   Total time spent: 20 minutes  Arzella Bitters, MD  08/05/2023   This document may have been prepared by [facility] Voice Recognition software and as such may include unintentional dictation errors.

## 2023-08-10 ENCOUNTER — Other Ambulatory Visit: Payer: Self-pay | Admitting: Cardiovascular Disease

## 2023-08-28 ENCOUNTER — Ambulatory Visit

## 2023-08-28 DIAGNOSIS — R9431 Abnormal electrocardiogram [ECG] [EKG]: Secondary | ICD-10-CM

## 2023-08-28 DIAGNOSIS — I10 Essential (primary) hypertension: Secondary | ICD-10-CM

## 2023-08-28 DIAGNOSIS — R0602 Shortness of breath: Secondary | ICD-10-CM | POA: Diagnosis not present

## 2023-08-28 DIAGNOSIS — R0789 Other chest pain: Secondary | ICD-10-CM | POA: Diagnosis not present

## 2023-08-28 DIAGNOSIS — E119 Type 2 diabetes mellitus without complications: Secondary | ICD-10-CM

## 2023-08-29 ENCOUNTER — Other Ambulatory Visit

## 2023-09-09 ENCOUNTER — Ambulatory Visit

## 2023-09-09 DIAGNOSIS — I361 Nonrheumatic tricuspid (valve) insufficiency: Secondary | ICD-10-CM | POA: Diagnosis not present

## 2023-09-09 DIAGNOSIS — E119 Type 2 diabetes mellitus without complications: Secondary | ICD-10-CM

## 2023-09-09 DIAGNOSIS — R0789 Other chest pain: Secondary | ICD-10-CM

## 2023-09-09 DIAGNOSIS — R9431 Abnormal electrocardiogram [ECG] [EKG]: Secondary | ICD-10-CM

## 2023-09-09 DIAGNOSIS — I340 Nonrheumatic mitral (valve) insufficiency: Secondary | ICD-10-CM | POA: Diagnosis not present

## 2023-09-09 DIAGNOSIS — I10 Essential (primary) hypertension: Secondary | ICD-10-CM

## 2023-09-09 DIAGNOSIS — R0602 Shortness of breath: Secondary | ICD-10-CM

## 2023-10-04 ENCOUNTER — Other Ambulatory Visit: Payer: Self-pay | Admitting: Internal Medicine

## 2023-10-04 DIAGNOSIS — E782 Mixed hyperlipidemia: Secondary | ICD-10-CM

## 2023-10-22 ENCOUNTER — Other Ambulatory Visit: Payer: Self-pay | Admitting: Internal Medicine

## 2023-10-22 DIAGNOSIS — E119 Type 2 diabetes mellitus without complications: Secondary | ICD-10-CM

## 2023-11-03 ENCOUNTER — Other Ambulatory Visit

## 2023-11-03 DIAGNOSIS — E119 Type 2 diabetes mellitus without complications: Secondary | ICD-10-CM

## 2023-11-03 DIAGNOSIS — E782 Mixed hyperlipidemia: Secondary | ICD-10-CM

## 2023-11-04 ENCOUNTER — Ambulatory Visit: Admitting: Internal Medicine

## 2023-11-04 ENCOUNTER — Ambulatory Visit: Payer: Self-pay | Admitting: Internal Medicine

## 2023-11-04 VITALS — BP 104/76 | HR 63 | Temp 98.8°F | Ht 70.0 in | Wt 185.6 lb

## 2023-11-04 DIAGNOSIS — K219 Gastro-esophageal reflux disease without esophagitis: Secondary | ICD-10-CM | POA: Diagnosis not present

## 2023-11-04 DIAGNOSIS — E119 Type 2 diabetes mellitus without complications: Secondary | ICD-10-CM | POA: Diagnosis not present

## 2023-11-04 DIAGNOSIS — E782 Mixed hyperlipidemia: Secondary | ICD-10-CM

## 2023-11-04 DIAGNOSIS — Z0130 Encounter for examination of blood pressure without abnormal findings: Secondary | ICD-10-CM

## 2023-11-04 LAB — HEMOGLOBIN A1C
Est. average glucose Bld gHb Est-mCnc: 143 mg/dL
Hgb A1c MFr Bld: 6.6 % — ABNORMAL HIGH (ref 4.8–5.6)

## 2023-11-04 LAB — LIPID PANEL
Chol/HDL Ratio: 3.2 ratio (ref 0.0–5.0)
Cholesterol, Total: 134 mg/dL (ref 100–199)
HDL: 42 mg/dL (ref >39)
LDL Chol Calc (NIH): 71 mg/dL (ref 0–99)
Triglycerides: 115 mg/dL (ref 0–149)
VLDL Cholesterol Cal: 21 mg/dL (ref 5–40)

## 2023-11-04 LAB — POCT CBG (FASTING - GLUCOSE)-MANUAL ENTRY: Glucose Fasting, POC: 102 mg/dL — AB (ref 70–99)

## 2023-11-04 MED ORDER — ROSUVASTATIN CALCIUM 20 MG PO TABS: 20.0000 mg | ORAL_TABLET | Freq: Every day | ORAL | 0 refills | Status: DC

## 2023-11-04 MED ORDER — PROPRANOLOL HCL 40 MG PO TABS: 40.0000 mg | ORAL_TABLET | Freq: Three times a day (TID) | ORAL | 1 refills | Status: DC

## 2023-11-04 MED ORDER — RAMIPRIL 5 MG PO CAPS: 5.0000 mg | ORAL_CAPSULE | Freq: Every day | ORAL | 3 refills | Status: AC

## 2023-11-04 NOTE — Progress Notes (Unsigned)
 Established Patient Office Visit  Subjective:  Patient ID: Daniel Hudson, male    DOB: 12/30/1980  Age: 43 y.o. MRN: [id number]  Chief Complaint  Patient presents with   Follow-up    3 month lab results     No new complaints, here for lab review and medication refills. Labs reviewed and notable for well controlled/uncontrolled diabetes, A1c not / at target, lipids at target with unremarkable cmp. Denies any hypoglycemic episodes and home bg readings have been at target. C/o epigastric discomfort over the last several months with dyspepsia especially with certain meals.     No other concerns at this time.   Past Medical History:  Diagnosis Date   High cholesterol    Hypertension     Past Surgical History:  Procedure Laterality Date   ESOPHAGOGASTRODUODENOSCOPY (EGD) WITH PROPOFOL  N/A 01/30/2015   Procedure: ESOPHAGOGASTRODUODENOSCOPY (EGD) WITH PROPOFOL ;  Surgeon: Gladis RAYMOND Mariner, MD;  Location: [facility] ENDOSCOPY;  Service: Endoscopy;  Laterality: N/A;    Social History   Socioeconomic History   Marital status: Married    Spouse name: Not on file   Number of children: Not on file   Years of education: Not on file   Highest education level: Not on file  Occupational History   Not on file  Tobacco Use   Smoking status: Former    Types: Cigarettes   Smokeless tobacco: Never  Substance and Sexual Activity   Alcohol use: No   Drug use: No   Sexual activity: Not on file  Other Topics Concern   Not on file  Social History Narrative   Not on file   Social Drivers of Health   Financial Resource Strain: Not on file  Food Insecurity: Not on file  Transportation Needs: Not on file  Physical Activity: Not on file  Stress: Not on file  Social Connections: Not on file  Intimate Partner Violence: Not on file    No family history on file.  No Known Allergies  Outpatient Medications Prior to Visit  Medication Sig   acetaminophen -codeine (TYLENOL  #3) 300-30 MG tablet  Take 1 tablet by mouth as needed.   CONTOUR NEXT TEST test strip TEST TWICE DAILY   escitalopram (LEXAPRO) 10 MG tablet TAKE 1 TABLET BY MOUTH IN THE  MORNING   metFORMIN  (GLUCOPHAGE ) 500 MG tablet Take 1 tablet (500 mg total) by mouth 2 (two) times daily with a meal.   [DISCONTINUED] propranolol  (INDERAL ) 40 MG tablet Take 40 mg by mouth 3 (three) times daily.   [DISCONTINUED] ramipril  (ALTACE ) 5 MG capsule TAKE 1 CAPSULE BY MOUTH DAILY   [DISCONTINUED] rosuvastatin  (CRESTOR ) 20 MG tablet TAKE 1 TABLET BY MOUTH AT  BEDTIME   ranolazine  (RANEXA ) 500 MG 12 hr tablet TAKE 1 TABLET BY MOUTH TWICE A DAY (Patient not taking: Reported on 11/04/2023)   No facility-administered medications prior to visit.    Review of Systems  Constitutional:  Positive for weight loss (2 lbs). Negative for malaise/fatigue.  Respiratory: Negative.    Cardiovascular:  Positive for chest pain.  Gastrointestinal:  Positive for heartburn.  Genitourinary: Negative.   Musculoskeletal: Negative.   Neurological: Negative.        Objective:   BP 104/76   Pulse 63   Temp 98.8 F (37.1 C)   Ht 5' 10 (1.778 m)   Wt 185 lb 9.6 oz (84.2 kg)   SpO2 98%   BMI 26.63 kg/m   Vitals:   11/04/23 1446  BP: 104/76  Pulse:  63  Temp: 98.8 F (37.1 C)  Height: 5' 10 (1.778 m)  Weight: 185 lb 9.6 oz (84.2 kg)  SpO2: 98%  BMI (Calculated): 26.63    Physical Exam Vitals reviewed.  Constitutional:      Appearance: Normal appearance. He is normal weight.  HENT:     Head: Normocephalic.     Left Ear: There is no impacted cerumen.     Nose: Nose normal.     Mouth/Throat:     Mouth: Mucous membranes are moist.     Pharynx: No posterior oropharyngeal erythema.  Eyes:     Extraocular Movements: Extraocular movements intact.     Pupils: Pupils are equal, round, and reactive to light.  Cardiovascular:     Rate and Rhythm: Regular rhythm.     Chest Wall: PMI is not displaced.     Pulses: Normal pulses.     Heart  sounds: Normal heart sounds. No murmur heard. Pulmonary:     Effort: Pulmonary effort is normal.     Breath sounds: Normal air entry. No rhonchi or rales.  Abdominal:     General: Abdomen is flat. Bowel sounds are normal. There is no distension.     Palpations: Abdomen is soft. There is no hepatomegaly, splenomegaly or mass.     Tenderness: There is no abdominal tenderness.  Musculoskeletal:        General: Normal range of motion.     Cervical back: Normal range of motion and neck supple.     Right lower leg: No edema.     Left lower leg: No edema.  Skin:    General: Skin is warm and dry.  Neurological:     General: No focal deficit present.     Mental Status: He is alert and oriented to person, place, and time.     Cranial Nerves: No cranial nerve deficit.     Motor: No weakness.  Psychiatric:        Mood and Affect: Mood normal.        Behavior: Behavior normal.      Results for orders placed or performed in visit on 11/04/23  POCT CBG (Fasting - Glucose)  Result Value Ref Range   Glucose Fasting, POC 102 (A) 70 - 99 mg/dL    Recent Results (from the past 2160 hours)  Lipid panel     Status: None   Collection Time: 11/03/23 10:07 AM  Result Value Ref Range   Cholesterol, Total 134 100 - 199 mg/dL   Triglycerides 884 0 - 149 mg/dL   HDL 42 >60 mg/dL   VLDL Cholesterol Cal 21 5 - 40 mg/dL   LDL Chol Calc (NIH) 71 0 - 99 mg/dL   Chol/HDL Ratio 3.2 0.0 - 5.0 ratio    Comment:                                   T. Chol/HDL Ratio                                             Men  Women                               1/2 Avg.Risk  3.4    3.3  Avg.Risk  5.0    4.4                                2X Avg.Risk  9.6    7.1                                3X Avg.Risk 23.4   11.0   Hemoglobin A1c     Status: Abnormal   Collection Time: 11/03/23 10:07 AM  Result Value Ref Range   Hgb A1c MFr Bld 6.6 (H) 4.8 - 5.6 %    Comment:          Prediabetes:  5.7 - 6.4          Diabetes: >6.4          Glycemic control for adults with diabetes: <7.0    Est. average glucose Bld gHb Est-mCnc 143 mg/dL  POCT CBG (Fasting - Glucose)     Status: Abnormal   Collection Time: 11/04/23  2:56 PM  Result Value Ref Range   Glucose Fasting, POC 102 (A) 70 - 99 mg/dL      Assessment & Plan:  As per problem list  Problem List Items Addressed This Visit       Endocrine   Type 2 diabetes mellitus without complication, without long-term current use of insulin (HCC) - Primary   Relevant Medications   ramipril  (ALTACE ) 5 MG capsule   rosuvastatin  (CRESTOR ) 20 MG tablet   Other Relevant Orders   POCT CBG (Fasting - Glucose) (Completed)     Other   Mixed hyperlipidemia   Relevant Medications   ramipril  (ALTACE ) 5 MG capsule   propranolol  (INDERAL ) 40 MG tablet   rosuvastatin  (CRESTOR ) 20 MG tablet   Other Relevant Orders   POCT CBG (Fasting - Glucose) (Completed)   Other Visit Diagnoses       Gastroesophageal reflux disease without esophagitis       Relevant Orders   H. pylori breath test       Return in about 3 months (around 02/04/2024) for fu with labs prior.   Total time spent: 30 minutes  Sherrill Cinderella Perry, MD  11/04/2023   This document may have been prepared by [employer] Voice Recognition software and as such may include unintentional dictation errors.

## 2023-11-05 LAB — H. PYLORI BREATH TEST: H pylori Breath Test: NEGATIVE

## 2023-11-07 ENCOUNTER — Ambulatory Visit: Payer: Self-pay | Admitting: Internal Medicine

## 2023-11-07 NOTE — Progress Notes (Signed)
Pt informed

## 2023-11-20 ENCOUNTER — Other Ambulatory Visit: Payer: Self-pay

## 2023-11-21 MED ORDER — ESCITALOPRAM OXALATE 10 MG PO TABS
10.0000 mg | ORAL_TABLET | Freq: Every morning | ORAL | 0 refills | Status: DC
Start: 2023-11-21 — End: 2024-02-16

## 2023-11-21 MED ORDER — ACETAMINOPHEN-CODEINE 300-30 MG PO TABS: 1.0000 | ORAL_TABLET | Freq: Four times a day (QID) | ORAL | 0 refills | Status: DC | PRN

## 2023-12-22 ENCOUNTER — Encounter: Payer: Self-pay | Admitting: Cardiology

## 2023-12-22 ENCOUNTER — Ambulatory Visit: Admitting: Cardiology

## 2023-12-22 VITALS — BP 120/68 | HR 64 | Ht 70.0 in | Wt 187.0 lb

## 2023-12-22 DIAGNOSIS — M5440 Lumbago with sciatica, unspecified side: Secondary | ICD-10-CM | POA: Diagnosis not present

## 2023-12-22 DIAGNOSIS — R109 Unspecified abdominal pain: Secondary | ICD-10-CM | POA: Insufficient documentation

## 2023-12-22 NOTE — Progress Notes (Signed)
 Established Patient Office Visit  Subjective:  Patient ID: Daniel Hudson, male    DOB: 1980/05/17  Age: 43 y.o. MRN: [id number]  Chief Complaint  Patient presents with   Acute Visit    L Arm and L Leg Pain off and on, possible gallbladder or bread intolerance    Patient in office for an acute visit, left arm and left leg pain off and on for the past year. Patient states he notices the pain more after he eats bread, concerned he may have celiac disease. Will check a celiac panel. Will order an abdominal ultrasound to check gallbladder. Patient reports lower back pain, occasional numbness and tingling, will order a lumbar back xray.     No other concerns at this time.   Past Medical History:  Diagnosis Date   High cholesterol    Hypertension     Past Surgical History:  Procedure Laterality Date   ESOPHAGOGASTRODUODENOSCOPY (EGD) WITH PROPOFOL  N/A 01/30/2015   Procedure: ESOPHAGOGASTRODUODENOSCOPY (EGD) WITH PROPOFOL ;  Surgeon: Gladis RAYMOND Mariner, MD;  Location: [facility] ENDOSCOPY;  Service: Endoscopy;  Laterality: N/A;    Social History   Socioeconomic History   Marital status: Married    Spouse name: Not on file   Number of children: Not on file   Years of education: Not on file   Highest education level: Not on file  Occupational History   Not on file  Tobacco Use   Smoking status: Former    Types: Cigarettes   Smokeless tobacco: Never  Substance and Sexual Activity   Alcohol use: No   Drug use: No   Sexual activity: Not on file  Other Topics Concern   Not on file  Social History Narrative   Not on file   Social Drivers of Health   Financial Resource Strain: Not on file  Food Insecurity: Not on file  Transportation Needs: Not on file  Physical Activity: Not on file  Stress: Not on file  Social Connections: Not on file  Intimate Partner Violence: Not on file    History reviewed. No pertinent family history.  No Known Allergies  Outpatient Medications  Prior to Visit  Medication Sig   acetaminophen -codeine  (TYLENOL  #3) 300-30 MG tablet Take 1 tablet by mouth every 6 (six) hours as needed for severe pain (pain score 7-10).   CONTOUR NEXT TEST test strip TEST TWICE DAILY   escitalopram  (LEXAPRO ) 10 MG tablet Take 1 tablet (10 mg total) by mouth every morning.   metFORMIN  (GLUCOPHAGE ) 500 MG tablet Take 1 tablet (500 mg total) by mouth 2 (two) times daily with a meal.   propranolol  (INDERAL ) 40 MG tablet Take 1 tablet (40 mg total) by mouth 3 (three) times daily.   ramipril  (ALTACE ) 5 MG capsule Take 1 capsule (5 mg total) by mouth daily.   rosuvastatin  (CRESTOR ) 20 MG tablet Take 1 tablet (20 mg total) by mouth at bedtime.   [DISCONTINUED] ranolazine  (RANEXA ) 500 MG 12 hr tablet TAKE 1 TABLET BY MOUTH TWICE A DAY (Patient not taking: Reported on 11/04/2023)   No facility-administered medications prior to visit.    Review of Systems  Constitutional: Negative.   HENT: Negative.    Eyes: Negative.   Respiratory: Negative.  Negative for shortness of breath.   Cardiovascular: Negative.  Negative for chest pain.  Gastrointestinal: Negative.  Negative for abdominal pain, constipation and diarrhea.  Genitourinary: Negative.   Musculoskeletal:  Positive for back pain. Negative for joint pain and myalgias.  Skin: Negative.  Neurological:  Positive for tingling. Negative for dizziness and headaches.  Endo/Heme/Allergies: Negative.   All other systems reviewed and are negative.      Objective:   BP 120/68   Pulse 64   Ht 5' 10 (1.778 m)   Wt 187 lb (84.8 kg)   SpO2 97%   BMI 26.83 kg/m   Vitals:   12/22/23 1038  BP: 120/68  Pulse: 64  Height: 5' 10 (1.778 m)  Weight: 187 lb (84.8 kg)  SpO2: 97%  BMI (Calculated): 26.83    Physical Exam Nursing note reviewed.  Constitutional:      Appearance: Normal appearance. He is normal weight.  HENT:     Head: Normocephalic and atraumatic.     Nose: Nose normal.     Mouth/Throat:      Mouth: Mucous membranes are moist.     Pharynx: Oropharynx is clear.  Eyes:     Extraocular Movements: Extraocular movements intact.     Conjunctiva/sclera: Conjunctivae normal.     Pupils: Pupils are equal, round, and reactive to light.  Cardiovascular:     Rate and Rhythm: Normal rate and regular rhythm.     Pulses: Normal pulses.     Heart sounds: Normal heart sounds.  Pulmonary:     Effort: Pulmonary effort is normal.     Breath sounds: Normal breath sounds.  Abdominal:     General: Abdomen is flat. Bowel sounds are normal.     Palpations: Abdomen is soft.  Musculoskeletal:        General: Normal range of motion.     Cervical back: Normal range of motion.  Skin:    General: Skin is warm and dry.  Neurological:     General: No focal deficit present.     Mental Status: He is alert and oriented to person, place, and time.  Psychiatric:        Mood and Affect: Mood normal.        Behavior: Behavior normal.        Thought Content: Thought content normal.        Judgment: Judgment normal.      No results found for any visits on 12/22/23.  Recent Results (from the past 2160 hours)  Lipid panel     Status: None   Collection Time: 11/03/23 10:07 AM  Result Value Ref Range   Cholesterol, Total 134 100 - 199 mg/dL   Triglycerides 884 0 - 149 mg/dL   HDL 42 >60 mg/dL   VLDL Cholesterol Cal 21 5 - 40 mg/dL   LDL Chol Calc (NIH) 71 0 - 99 mg/dL   Chol/HDL Ratio 3.2 0.0 - 5.0 ratio    Comment:                                   T. Chol/HDL Ratio                                             Men  Women                               1/2 Avg.Risk  3.4    3.3  Avg.Risk  5.0    4.4                                2X Avg.Risk  9.6    7.1                                3X Avg.Risk 23.4   11.0   Hemoglobin A1c     Status: Abnormal   Collection Time: 11/03/23 10:07 AM  Result Value Ref Range   Hgb A1c MFr Bld 6.6 (H) 4.8 - 5.6 %    Comment:           Prediabetes: 5.7 - 6.4          Diabetes: >6.4          Glycemic control for adults with diabetes: <7.0    Est. average glucose Bld gHb Est-mCnc 143 mg/dL  POCT CBG (Fasting - Glucose)     Status: Abnormal   Collection Time: 11/04/23  2:56 PM  Result Value Ref Range   Glucose Fasting, POC 102 (A) 70 - 99 mg/dL  H. pylori breath test     Status: None   Collection Time: 11/04/23  3:35 PM  Result Value Ref Range   H pylori Breath Test Negative Negative      Assessment & Plan:  Blood work today Lumbar back xray Abdominal ultrasound  Problem List Items Addressed This Visit       Nervous and Auditory   Acute low back pain with sciatica   Relevant Orders   DG Lumbar Spine Complete     Other   Abdominal pain - Primary   Relevant Orders   Celiac Panel   US  ABDOMEN LIMITED RUQ (LIVER/GB)    Return in about 2 weeks (around 01/05/2024).   Total time spent: 25 minutes  [employer], NP  12/22/2023   This document may have been prepared by [employer] Voice Recognition software and as such may include unintentional dictation errors.

## 2023-12-24 ENCOUNTER — Ambulatory Visit
Admission: RE | Admit: 2023-12-24 | Discharge: 2023-12-24 | Disposition: A | Discharge status: Home / Self Care | Source: Ambulatory Visit | Attending: Cardiology | Admitting: Cardiology

## 2023-12-24 ENCOUNTER — Ambulatory Visit
Admission: RE | Admit: 2023-12-24 | Discharge: 2023-12-24 | Disposition: A | Discharge status: Home / Self Care | Payer: Self-pay | Source: Ambulatory Visit | Attending: Cardiology | Admitting: Cardiology

## 2023-12-24 DIAGNOSIS — R109 Unspecified abdominal pain: Secondary | ICD-10-CM | POA: Diagnosis present

## 2023-12-24 DIAGNOSIS — M5440 Lumbago with sciatica, unspecified side: Secondary | ICD-10-CM | POA: Diagnosis present

## 2023-12-26 LAB — GLIA (IGA/G) + TTG IGA
Antigliadin Abs, IgA: 5 U (ref 0–19)
Gliadin IgG: 2 U (ref 0–19)
Transglutaminase IgA: <2 U/mL (ref 0–3)

## 2023-12-29 ENCOUNTER — Ambulatory Visit: Payer: Self-pay | Admitting: Cardiology

## 2023-12-30 ENCOUNTER — Other Ambulatory Visit: Payer: Self-pay | Admitting: Cardiology

## 2023-12-30 DIAGNOSIS — M5440 Lumbago with sciatica, unspecified side: Secondary | ICD-10-CM

## 2024-01-05 ENCOUNTER — Ambulatory Visit: Admitting: Cardiology

## 2024-02-03 ENCOUNTER — Ambulatory Visit: Admitting: Internal Medicine

## 2024-02-11 ENCOUNTER — Ambulatory Visit: Attending: Cardiology

## 2024-02-11 ENCOUNTER — Encounter: Payer: Self-pay | Admitting: Physical Therapy

## 2024-02-11 DIAGNOSIS — M542 Cervicalgia: Secondary | ICD-10-CM | POA: Diagnosis present

## 2024-02-11 DIAGNOSIS — M6281 Muscle weakness (generalized): Secondary | ICD-10-CM | POA: Diagnosis present

## 2024-02-11 DIAGNOSIS — M5440 Lumbago with sciatica, unspecified side: Secondary | ICD-10-CM | POA: Diagnosis not present

## 2024-02-11 DIAGNOSIS — M5459 Other low back pain: Secondary | ICD-10-CM | POA: Diagnosis present

## 2024-02-11 NOTE — Therapy (Signed)
 OUTPATIENT PHYSICAL THERAPY THORACOLUMBAR EVALUATION   Patient Name: Daniel Hudson MRN: [id number] DOB:Aug 16, 1980, 43 y.o., male Today's Date: 02/11/2024  END OF SESSION:  PT End of Session - 02/11/24 1520     Visit Number 1    Number of Visits 17    Date for Recertification  04/07/24    PT Start Time 1519    PT Stop Time 1559    PT Time Calculation (min) 40 min    Activity Tolerance Patient tolerated treatment well    Behavior During Therapy [facility] for tasks assessed/performed          Past Medical History:  Diagnosis Date   High cholesterol    Hypertension    Past Surgical History:  Procedure Laterality Date   ESOPHAGOGASTRODUODENOSCOPY (EGD) WITH PROPOFOL  N/A 01/30/2015   Procedure: ESOPHAGOGASTRODUODENOSCOPY (EGD) WITH PROPOFOL ;  Surgeon: Gladis RAYMOND Mariner, MD;  Location: [facility] ENDOSCOPY;  Service: Endoscopy;  Laterality: N/A;   Patient Active Problem List   Diagnosis Date Noted   Abdominal pain 12/22/2023   Acute low back pain with sciatica 12/22/2023   Type 2 diabetes mellitus without complication, without long-term current use of insulin (HCC) 08/05/2023   Mixed hyperlipidemia 08/05/2023    PCP: Albina GORMAN Dine, MD   REFERRING PROVIDER: Carin Gauze, NP  REFERRING DIAG:  M54.40 (ICD-10-CM) - Acute low back pain with sciatica, sciatica laterality unspecified, unspecified back pain laterality  Rationale for Evaluation and Treatment: Rehabilitation  THERAPY DIAG:  Muscle weakness (generalized)  Cervicalgia  Other low back pain  ONSET DATE: 1 year ago   SUBJECTIVE:                                                                                                                                                                                           SUBJECTIVE STATEMENT: Patient reports he has pain in L chest/shoulder/arm and neck as well as low back pain with L sided radiculopathy. He has been to chiropractor and had stretches which seemed to help. His  primary concern is his neck with radicular symptoms.   PERTINENT HISTORY:  Per NP note on 12/22/23, patient reports lower back pain, occasional numbness and tingling. Per telephone note in chart, patient also complaining of severe neck pain.   PAIN:  Are you having pain? Yes: NPRS scale: 5/10 current; worst 10/10  Pain location: L shoulder/arm  Pain description: aching  Aggravating factors: unsure, random  Relieving factors: heat  PRECAUTIONS: None  RED FLAGS: None   WEIGHT BEARING RESTRICTIONS: No  FALLS:  Has patient fallen in last 6 months? No  LIVING ENVIRONMENT: Lives with: lives with their family Lives in:  House/apartment  OCCUPATION: Biomedical scientist   PLOF: Independent  PATIENT GOALS: to get rid of this pain   OBJECTIVE:  Note: Objective measures were completed at Evaluation unless otherwise noted.  DIAGNOSTIC FINDINGS:  EXAM: LUMBAR SPINE - COMPLETE 4+ VIEW IMPRESSION: 1. Mild lumbar spondylosis with lower lumbar facet spurring. 2. Moderate fecal stasis. 3. No evidence of fractures.  PATIENT SURVEYS:  Modified Oswestry:  MODIFIED OSWESTRY DISABILITY SCALE  Date: 02/11/24 Score  Pain intensity 3 =  Pain medication provides me with moderate relief from pain.  2. Personal care (washing, dressing, etc.) 0 =  I can take care of myself normally without causing increased pain.  3. Lifting 0 = I can lift heavy weights without increased pain.  4. Walking 0 = Pain does not prevent me from walking any distance  5. Sitting 0 =  I can sit in any chair as long as I like.  6. Standing 0 =  I can stand as long as I want without increased pain.  7. Sleeping 1 = I can sleep well only by using pain medication.  8. Social Life 0 = My social life is normal and does not increase my pain.  9. Traveling 0 =  I can travel anywhere without increased pain.  10. Employment/ Homemaking 0 = My normal homemaking/job activities do not cause pain.  Total 4/50 = 8%   Interpretation of  scores: Score Category Description  0-20% Minimal Disability The patient can cope with most living activities. Usually no treatment is indicated apart from advice on lifting, sitting and exercise  21-40% Moderate Disability The patient experiences more pain and difficulty with sitting, lifting and standing. Travel and social life are more difficult and they may be disabled from work. Personal care, sexual activity and sleeping are not grossly affected, and the patient can usually be managed by conservative means  41-60% Severe Disability Pain remains the main problem in this group, but activities of daily living are affected. These patients require a detailed investigation  61-80% Crippled Back pain impinges on all aspects of the patient's life. Positive intervention is required  81-100% Bed-bound These patients are either bed-bound or exaggerating their symptoms  Bluford FORBES Zoe DELENA Karon DELENA, et al. Surgery versus conservative management of stable thoracolumbar fracture: the PRESTO feasibility RCT. [place] ([place]): [employer]; 2021 Nov. [facility] Technology Assessment, No. 25.62.) Appendix 3, Oswestry Disability Index category descriptors. Available from: Findjewelers.cz  Minimally Clinically Important Difference (MCID) = 12.8%  NDI:  NECK DISABILITY INDEX  Date: 02/11/24 Score  Pain intensity 2 = The pain is moderate at the moment  2. Personal care (washing, dressing, etc.) 0 = I can look after myself normally without causing extra pain  3. Lifting 0 =  I can lift heavy weights without extra pain  4. Reading 1 = I can read as much as I want to with slight pain in my neck  5. Headaches 3 = I have moderate headaches, which come frequently  6. Concentration 1 =  I can concentrate fully when I want to with slight difficulty   7. Work 0 =  I can do as much work as I want to  8. Driving 1 =  I can drive my car as long as I want with slight pain in my neck   9. Sleeping 0 = I have no trouble sleeping  10. Recreation 0 = I am able to engage in all my recreation activities with no neck pain at  all  Total 8/50 = 16%   Minimum Detectable Change (90% confidence): 5 points or 10% points  COGNITION: Overall cognitive status: Within functional limits for tasks assessed     SENSATION: WFL  MUSCLE LENGTH: Hamstrings: Right 50 deg; Left 44 deg  POSTURE: rounded shoulders  PALPATION: Tightness noted to L>R upper traps and levator   CERVICAL ROM:   Active ROM A/PROM (deg) eval  Flexion WFL  Extension WFL  Right lateral flexion WFL  Left lateral flexion WFL  Right rotation WFL  Left rotation WFL   (Blank rows = not tested)  LUMBAR ROM:   AROM eval  Flexion WFL  Extension WFL  Right lateral flexion WFL  Left lateral flexion WFL  Right rotation WFL  Left rotation WFL   (Blank rows = not tested)  UPPER EXTREMITY MMT:  MMT Right eval Left eval  Shoulder flexion 4 4  Shoulder extension    Shoulder abduction 4 4  Shoulder adduction    Shoulder extension    Shoulder internal rotation    Shoulder external rotation    Middle trapezius    Lower trapezius    Elbow flexion 4+ 4+  Elbow extension 4+ 4+  Wrist flexion    Wrist extension    Wrist ulnar deviation    Wrist radial deviation    Wrist pronation    Wrist supination    Grip strength     (Blank rows = not tested)   LOWER EXTREMITY MMT:    MMT Right eval Left eval  Hip flexion 4 4  Hip extension    Hip abduction 4 4  Hip adduction 4+ 4+  Hip internal rotation    Hip external rotation    Knee flexion 4+ 4+  Knee extension 4+ 4+  Ankle dorsiflexion 4+ 4+  Ankle plantarflexion    Ankle inversion    Ankle eversion     (Blank rows = not tested)  LUMBAR SPECIAL TESTS:  Straight leg raise test: Positive on L and FABER test: Negative  CERVICAL SPECIAL TESTS:   Spurling's: NT; Distraction: positive - relieves L sided neck and arm symptoms      GAIT: Distance walked: 65' Assistive device utilized: None Level of assistance: Complete Independence Comments: no obvious gait deviations appreciated   TREATMENT DATE: 02/11/24                                                                                                                               Seated UT stretch x 30 seconds each side  Seated levator stretch x 30 seconds each side  Seated hamstring stretch x 30 seconds each side  Seated piriformis stretch x 30 seconds each side   PATIENT EDUCATION:  Education details: HEP, POC, goals  Person educated: Patient Education method: Explanation, Demonstration, and Handouts Education comprehension: verbalized understanding and returned demonstration  HOME EXERCISE PROGRAM: Access Code: [email or web address] URL: [email or web address] Date: 02/11/2024 Prepared by: Maryanne Finder  Exercises -  Seated Hamstring Stretch  - 1-2 x daily - 5-7 x weekly - 3-5 reps - 30-60 seconds  hold - Seated Piriformis Stretch  - 1-2 x daily - 5-7 x weekly - 3-5 reps - 30-60 seconds hold - Seated Upper Trapezius Stretch  - 1-2 x daily - 5-7 x weekly - 3-5 reps - 30-60 seconds hold - Seated Levator Scapulae Stretch  - 1-2 x daily - 5-7 x weekly - 3-5 reps - 30-60 seconds  hold  ASSESSMENT:  CLINICAL IMPRESSION: Patient is a 43 y.o. male who was seen today for physical therapy evaluation and treatment for neck and low back pain. Primary concern is neck pain with L UE symptoms. Patient demonstrates muscular tightness in B hips/buttocks as well as cervical region (upper trap, levator). Patient with LUE symptom relief with cervical distraction. Improved symptoms and pain at end of session following stretching. HEP initiated with upper trap, levator, hamstring, and piriformis stretching. Patient will benefit from skilled PT services to address listed impairments to improve quality of life and reduce pain.   OBJECTIVE IMPAIRMENTS: decreased activity  tolerance, decreased endurance, difficulty walking, decreased ROM, decreased strength, impaired flexibility, postural dysfunction, and pain.   ACTIVITY LIMITATIONS: carrying, lifting, sleeping, and locomotion level  PARTICIPATION LIMITATIONS: cleaning, community activity, and yard work  PERSONAL FACTORS: Age, Behavior pattern, and Time since onset of injury/illness/exacerbation are also affecting patient's functional outcome.   REHAB POTENTIAL: Good  CLINICAL DECISION MAKING: Evolving/moderate complexity  EVALUATION COMPLEXITY: Moderate   GOALS: Goals reviewed with patient? Yes  SHORT TERM GOALS: Target date: 03/10/2024  Patient will be independent in HEP to improve strength/mobility for better functional independence with ADLs. Baseline: Goal status: INITIAL   LONG TERM GOALS: Target date: 04/07/2024  Patient will reduce Neck Disability Index score to <10% to demonstrate minimal disability with ADL's including improved sleeping tolerance, sitting tolerance, etc for better mobility at home and work. Baseline: 02/11/24: 8/50 = 16% Goal status: INITIAL  2.  Patient will report decrease in frequency of headaches to <2/week to demonstrate improved muscle tightness of upper traps and levator and improve tolerance to activity without frequent headaches.  Baseline: 02/11/24: frequent (daily) Goal status: INITIAL  3.  Patient will improve B UE/LE strength by 1/3 MMT grade to demonstrate improved functional strength to be able to complete ADLs and work duties.  Baseline: 02/11/24: see above  Goal status: INITIAL  4.  Patient will report a worst pain of 3/10 on NRPS in neck and low back to improve tolerance with ADLs and reduced symptoms with activities.  Baseline: 02/11/24: 10/10 pain Goal status: INITIAL   PLAN:  PT FREQUENCY: 1-2x/week  PT DURATION: 8 weeks  PLANNED INTERVENTIONS: 97164- PT Re-evaluation, 97750- Physical Performance Testing, 97110-Therapeutic exercises, 97530-  Therapeutic activity, 97112- Neuromuscular re-education, 97535- Self Care, 02859- Manual therapy, [phone]- Gait training, [phone]- Electrical stimulation (unattended), [phone]- Electrical stimulation (manual), [phone] (1-2 muscles), [phone] (3+ muscles)- Dry Needling, Patient/Family education, Taping, Joint mobilization, Joint manipulation, Spinal manipulation, Spinal mobilization, Cryotherapy, and Moist heat.  PLAN FOR NEXT SESSION: HEP review, cervical distraction/stretching, LE/low back stretching    Maryanne Finder, PT, DPT Physical Therapist - [facility]  [facility] 02/11/2024, 4:22 PM

## 2024-02-16 ENCOUNTER — Other Ambulatory Visit: Payer: Self-pay | Admitting: Internal Medicine

## 2024-02-16 ENCOUNTER — Other Ambulatory Visit

## 2024-02-16 DIAGNOSIS — E119 Type 2 diabetes mellitus without complications: Secondary | ICD-10-CM

## 2024-02-16 DIAGNOSIS — E782 Mixed hyperlipidemia: Secondary | ICD-10-CM

## 2024-02-17 ENCOUNTER — Ambulatory Visit: Admitting: Physical Therapy

## 2024-02-17 ENCOUNTER — Encounter: Payer: Self-pay | Admitting: Internal Medicine

## 2024-02-17 ENCOUNTER — Ambulatory Visit: Admitting: Internal Medicine

## 2024-02-17 ENCOUNTER — Ambulatory Visit: Payer: Self-pay | Admitting: Internal Medicine

## 2024-02-17 VITALS — BP 120/82 | HR 70 | Ht 70.0 in | Wt 187.0 lb

## 2024-02-17 DIAGNOSIS — E782 Mixed hyperlipidemia: Secondary | ICD-10-CM | POA: Diagnosis not present

## 2024-02-17 DIAGNOSIS — G43109 Migraine with aura, not intractable, without status migrainosus: Secondary | ICD-10-CM

## 2024-02-17 DIAGNOSIS — E119 Type 2 diabetes mellitus without complications: Secondary | ICD-10-CM

## 2024-02-17 LAB — HEMOGLOBIN A1C
Est. average glucose Bld gHb Est-mCnc: 140 mg/dL
Hgb A1c MFr Bld: 6.5 % — ABNORMAL HIGH (ref 4.8–5.6)

## 2024-02-17 LAB — LIPID PANEL
Chol/HDL Ratio: 3.5 ratio (ref 0.0–5.0)
Cholesterol, Total: 165 mg/dL (ref 100–199)
HDL: 47 mg/dL (ref >39)
LDL Chol Calc (NIH): 96 mg/dL (ref 0–99)
Triglycerides: 121 mg/dL (ref 0–149)
VLDL Cholesterol Cal: 22 mg/dL (ref 5–40)

## 2024-02-17 LAB — POCT CBG (FASTING - GLUCOSE)-MANUAL ENTRY: Glucose Fasting, POC: 124 mg/dL — AB (ref 70–99)

## 2024-02-17 MED ORDER — ROSUVASTATIN CALCIUM 20 MG PO TABS: 20.0000 mg | ORAL_TABLET | Freq: Every day | ORAL | 0 refills | Status: AC

## 2024-02-17 MED ORDER — PROPRANOLOL HCL 40 MG PO TABS: 40.0000 mg | ORAL_TABLET | Freq: Every day | ORAL | 1 refills | Status: AC

## 2024-02-17 MED ORDER — ACETAMINOPHEN-CODEINE 300-30 MG PO TABS
1.0000 | ORAL_TABLET | Freq: Four times a day (QID) | ORAL | 0 refills | Status: AC | PRN
Start: 2024-02-17 — End: 2024-05-17

## 2024-02-17 NOTE — Progress Notes (Signed)
 Established Patient Office Visit  Subjective:  Patient ID: Daniel Hudson, male    DOB: [birth year]  Age: 43 y.o. MRN: [id number]  Chief Complaint  Patient presents with   Follow-up    3 month Lab results    No new complaints, here for lab review and medication refills. Labs reviewed and notable for well controlled diabetes, A1c improved and remains at target, lipids at target with unremarkable cmp. Denies any hypoglycemic episodes and home bg readings have been at target. Now solely diet controlled and stopped metformin  6 mo ago.    No other concerns at this time.   Past Medical History:  Diagnosis Date   High cholesterol    Hypertension     Past Surgical History:  Procedure Laterality Date   ESOPHAGOGASTRODUODENOSCOPY (EGD) WITH PROPOFOL  N/A 01/30/2015   Procedure: ESOPHAGOGASTRODUODENOSCOPY (EGD) WITH PROPOFOL ;  Surgeon: Gladis RAYMOND Mariner, MD;  Location: [facility] ENDOSCOPY;  Service: Endoscopy;  Laterality: N/A;    Social History   Socioeconomic History   Marital status: Married    Spouse name: Not on file   Number of children: Not on file   Years of education: Not on file   Highest education level: Not on file  Occupational History   Not on file  Tobacco Use   Smoking status: Former    Types: Cigarettes   Smokeless tobacco: Never  Substance and Sexual Activity   Alcohol use: No   Drug use: No   Sexual activity: Not on file  Other Topics Concern   Not on file  Social History Narrative   Not on file   Social Drivers of Health   Financial Resource Strain: Not on file  Food Insecurity: Not on file  Transportation Needs: Not on file  Physical Activity: Not on file  Stress: Not on file  Social Connections: Not on file  Intimate Partner Violence: Not on file    No family history on file.  No Known Allergies  Outpatient Medications Prior to Visit  Medication Sig   CONTOUR NEXT TEST test strip TEST TWICE DAILY   escitalopram  (LEXAPRO ) 10 MG tablet TAKE 1  TABLET (10 MG TOTAL) BY MOUTH EVERY MORNING.   ramipril  (ALTACE ) 5 MG capsule Take 1 capsule (5 mg total) by mouth daily.   [DISCONTINUED] acetaminophen -codeine  (TYLENOL  #3) 300-30 MG tablet Take 1 tablet by mouth every 6 (six) hours as needed for severe pain (pain score 7-10).   [DISCONTINUED] metFORMIN  (GLUCOPHAGE ) 500 MG tablet Take 1 tablet (500 mg total) by mouth 2 (two) times daily with a meal.   [DISCONTINUED] propranolol  (INDERAL ) 40 MG tablet Take 1 tablet (40 mg total) by mouth 3 (three) times daily.   [DISCONTINUED] rosuvastatin  (CRESTOR ) 20 MG tablet Take 1 tablet (20 mg total) by mouth at bedtime.   No facility-administered medications prior to visit.    Review of Systems  Constitutional:  Negative for malaise/fatigue and weight loss.  Respiratory: Negative.    Cardiovascular:  Negative for chest pain.  Gastrointestinal:  Positive for heartburn.  Genitourinary: Negative.   Musculoskeletal:  Positive for myalgias (left biceps and forearm).  Neurological:  Positive for headaches.       Objective:   BP 120/82 (Cuff Size: Normal)   Pulse 70   Ht 5' 10 (1.778 m)   Wt 187 lb (84.8 kg)   SpO2 95%   BMI 26.83 kg/m   Vitals:   02/17/24 1447 02/17/24 1518  BP: 98/60 120/82  Pulse: 70   Height: 5' 10 (  1.778 m)   Weight: 187 lb (84.8 kg)   SpO2: 95%   BMI (Calculated): 26.83     Physical Exam Vitals reviewed.  Constitutional:      Appearance: Normal appearance. He is normal weight.  HENT:     Head: Normocephalic.     Left Ear: There is no impacted cerumen.     Nose: Nose normal.     Mouth/Throat:     Mouth: Mucous membranes are moist.     Pharynx: No posterior oropharyngeal erythema.  Eyes:     Extraocular Movements: Extraocular movements intact.     Pupils: Pupils are equal, round, and reactive to light.  Cardiovascular:     Rate and Rhythm: Regular rhythm.     Chest Wall: PMI is not displaced.     Pulses: Normal pulses.     Heart sounds: Normal heart  sounds. No murmur heard. Pulmonary:     Effort: Pulmonary effort is normal.     Breath sounds: Normal air entry. No rhonchi or rales.  Abdominal:     General: Abdomen is flat. Bowel sounds are normal. There is no distension.     Palpations: Abdomen is soft. There is no hepatomegaly, splenomegaly or mass.     Tenderness: There is no abdominal tenderness.  Musculoskeletal:        General: Normal range of motion.     Cervical back: Normal range of motion and neck supple.     Right lower leg: No edema.     Left lower leg: No edema.  Skin:    General: Skin is warm and dry.  Neurological:     General: No focal deficit present.     Mental Status: He is alert and oriented to person, place, and time.     Cranial Nerves: No cranial nerve deficit.     Motor: No weakness.  Psychiatric:        Mood and Affect: Mood normal.        Behavior: Behavior normal.      Results for orders placed or performed in visit on 02/17/24  POCT CBG (Fasting - Glucose)  Result Value Ref Range   Glucose Fasting, POC 124 (A) 70 - 99 mg/dL    Recent Results (from the past 2160 hours)  Celiac Panel     Status: None   Collection Time: 12/22/23 11:00 AM  Result Value Ref Range   Deamidated Gliadin Abs, IgA 5 0 - 19 units    Comment:                    Negative                   0 - 19                    Weak Positive             20 - 30                    Moderate to Strong Positive   >30    Deamidated Gliadin Abs, IgG 2 0 - 19 units    Comment:                    Negative                   0 - 19  Weak Positive             20 - 30                    Moderate to Strong Positive   >30    t-Transglutaminase (tTG) IgA <2 0 - 3 U/mL    Comment:                               Negative        0 -  3                               Weak Positive   4 - 10                               Positive           >10  Tissue Transglutaminase (tTG) has been identified  as the endomysial antigen.  Studies  have demonstr-  ated that endomysial IgA antibodies have over 99%  specificity for gluten sensitive enteropathy.   Lipid panel     Status: None   Collection Time: 02/16/24  8:26 AM  Result Value Ref Range   Cholesterol, Total 165 100 - 199 mg/dL   Triglycerides 878 0 - 149 mg/dL   HDL 47 >60 mg/dL   VLDL Cholesterol Cal 22 5 - 40 mg/dL   LDL Chol Calc (NIH) 96 0 - 99 mg/dL   Chol/HDL Ratio 3.5 0.0 - 5.0 ratio    Comment:                                   T. Chol/HDL Ratio                                             Men  Women                               1/2 Avg.Risk  3.4    3.3                                   Avg.Risk  5.0    4.4                                2X Avg.Risk  9.6    7.1                                3X Avg.Risk 23.4   11.0   Hemoglobin A1c     Status: Abnormal   Collection Time: 02/16/24  8:26 AM  Result Value Ref Range   Hgb A1c MFr Bld 6.5 (H) 4.8 - 5.6 %    Comment:          Prediabetes: 5.7 - 6.4          Diabetes: >6.4  Glycemic control for adults with diabetes: <7.0    Est. average glucose Bld gHb Est-mCnc 140 mg/dL  POCT CBG (Fasting - Glucose)     Status: Abnormal   Collection Time: 02/17/24  2:50 PM  Result Value Ref Range   Glucose Fasting, POC 124 (A) 70 - 99 mg/dL      Assessment & Plan:  Daniel Hudson was seen today for follow-up.  Type 2 diabetes mellitus without complication, without long-term current use of insulin (HCC) -     POCT CBG (Fasting - Glucose) -     Hemoglobin A1c -     Comprehensive metabolic panel with GFR  Mixed hyperlipidemia -     Rosuvastatin  Calcium ; Take 1 tablet (20 mg total) by mouth at bedtime.  Dispense: 90 tablet; Refill: 0 -     Lipid panel  Controlled type 2 diabetes mellitus without complication, without long-term current use of insulin (HCC)  Migraine with aura and without status migrainosus, not intractable -     Propranolol  HCl; Take 1 tablet (40 mg total) by mouth daily.  Dispense: 90 tablet; Refill:  1 -     Acetaminophen -Codeine ; Take 1 tablet by mouth every 6 (six) hours as needed for severe pain (pain score 7-10).  Dispense: 120 tablet; Refill: 0   Dc metformin  and decrease propranolol  as migraines less frequent. Problem List Items Addressed This Visit       Endocrine   Type 2 diabetes mellitus without complication, without long-term current use of insulin (HCC) - Primary   Relevant Medications   rosuvastatin  (CRESTOR ) 20 MG tablet   Other Relevant Orders   POCT CBG (Fasting - Glucose) (Completed)   Comprehensive metabolic panel with GFR     Other   Mixed hyperlipidemia   Relevant Medications   propranolol  (INDERAL ) 40 MG tablet   rosuvastatin  (CRESTOR ) 20 MG tablet   Other Visit Diagnoses       Controlled type 2 diabetes mellitus without complication, without long-term current use of insulin (HCC)       Relevant Medications   rosuvastatin  (CRESTOR ) 20 MG tablet     Migraine with aura and without status migrainosus, not intractable       Relevant Medications   propranolol  (INDERAL ) 40 MG tablet   acetaminophen -codeine  (TYLENOL  #3) 300-30 MG tablet   rosuvastatin  (CRESTOR ) 20 MG tablet       Return in about 3 months (around 05/19/2024) for fu with labs prior.   Total time spent: 20 minutes. This time includes review of previous notes and results and patient face to face interaction during today'Daniel Hudson visit.    Sherrill Cinderella Perry, MD  02/17/2024   This document may have been prepared by [facility] Voice Recognition software and as such may include unintentional dictation errors.

## 2024-02-18 ENCOUNTER — Encounter: Payer: Self-pay | Admitting: Physical Therapy

## 2024-02-18 ENCOUNTER — Ambulatory Visit

## 2024-02-18 DIAGNOSIS — M542 Cervicalgia: Secondary | ICD-10-CM

## 2024-02-18 DIAGNOSIS — M6281 Muscle weakness (generalized): Secondary | ICD-10-CM | POA: Diagnosis not present

## 2024-02-18 DIAGNOSIS — M5459 Other low back pain: Secondary | ICD-10-CM

## 2024-02-18 NOTE — Therapy (Signed)
 OUTPATIENT PHYSICAL THERAPY THORACOLUMBAR TREATMENT   Patient Name: Daniel Hudson MRN: [id number] DOB:01-07-1981, 43 y.o., male Today's Date: 02/18/2024  END OF SESSION:  PT End of Session - 02/18/24 1042     Visit Number 2    Number of Visits 17    Date for Recertification  04/07/24    PT Start Time 1042    PT Stop Time 1125    PT Time Calculation (min) 43 min    Activity Tolerance Patient tolerated treatment well    Behavior During Therapy [facility] for tasks assessed/performed          Past Medical History:  Diagnosis Date   High cholesterol    Hypertension    Past Surgical History:  Procedure Laterality Date   ESOPHAGOGASTRODUODENOSCOPY (EGD) WITH PROPOFOL  N/A 01/30/2015   Procedure: ESOPHAGOGASTRODUODENOSCOPY (EGD) WITH PROPOFOL ;  Surgeon: Gladis RAYMOND Mariner, MD;  Location: [facility] ENDOSCOPY;  Service: Endoscopy;  Laterality: N/A;   Patient Active Problem List   Diagnosis Date Noted   Abdominal pain 12/22/2023   Acute low back pain with sciatica 12/22/2023   Type 2 diabetes mellitus without complication, without long-term current use of insulin (HCC) 08/05/2023   Mixed hyperlipidemia 08/05/2023    PCP: Albina GORMAN Dine, MD   REFERRING PROVIDER: Carin Gauze, NP  REFERRING DIAG:  M54.40 (ICD-10-CM) - Acute low back pain with sciatica, sciatica laterality unspecified, unspecified back pain laterality  Rationale for Evaluation and Treatment: Rehabilitation  THERAPY DIAG:  Muscle weakness (generalized)  Cervicalgia  Other low back pain  ONSET DATE: 1 year ago   SUBJECTIVE:                                                                                                                                                                                           SUBJECTIVE STATEMENT: Pt states pain about the same as eval even with exercises. Pain 5/10 NPS in neck.   Patient reports he has pain in L chest/shoulder/arm and neck as well as low back pain with L sided  radiculopathy. He has been to chiropractor and had stretches which seemed to help. His primary concern is his neck with radicular symptoms.   PERTINENT HISTORY:  Per NP note on 12/22/23, patient reports lower back pain, occasional numbness and tingling. Per telephone note in chart, patient also complaining of severe neck pain.   PAIN:  Are you having pain? Yes: NPRS scale: 5/10 current; worst 10/10  Pain location: L shoulder/arm  Pain description: aching  Aggravating factors: unsure, random  Relieving factors: heat  PRECAUTIONS: None  RED FLAGS: None   WEIGHT BEARING RESTRICTIONS: No  FALLS:  Has  patient fallen in last 6 months? No  LIVING ENVIRONMENT: Lives with: lives with their family Lives in: House/apartment  OCCUPATION: Biomedical scientist   PLOF: Independent  PATIENT GOALS: to get rid of this pain   OBJECTIVE:  Note: Objective measures were completed at Evaluation unless otherwise noted.  DIAGNOSTIC FINDINGS:  EXAM: LUMBAR SPINE - COMPLETE 4+ VIEW IMPRESSION: 1. Mild lumbar spondylosis with lower lumbar facet spurring. 2. Moderate fecal stasis. 3. No evidence of fractures.  PATIENT SURVEYS:  Modified Oswestry:  MODIFIED OSWESTRY DISABILITY SCALE  Date: 02/11/24 Score  Pain intensity 3 =  Pain medication provides me with moderate relief from pain.  2. Personal care (washing, dressing, etc.) 0 =  I can take care of myself normally without causing increased pain.  3. Lifting 0 = I can lift heavy weights without increased pain.  4. Walking 0 = Pain does not prevent me from walking any distance  5. Sitting 0 =  I can sit in any chair as long as I like.  6. Standing 0 =  I can stand as long as I want without increased pain.  7. Sleeping 1 = I can sleep well only by using pain medication.  8. Social Life 0 = My social life is normal and does not increase my pain.  9. Traveling 0 =  I can travel anywhere without increased pain.  10. Employment/ Homemaking 0 = My normal  homemaking/job activities do not cause pain.  Total 4/50 = 8%   Interpretation of scores: Score Category Description  0-20% Minimal Disability The patient can cope with most living activities. Usually no treatment is indicated apart from advice on lifting, sitting and exercise  21-40% Moderate Disability The patient experiences more pain and difficulty with sitting, lifting and standing. Travel and social life are more difficult and they may be disabled from work. Personal care, sexual activity and sleeping are not grossly affected, and the patient can usually be managed by conservative means  41-60% Severe Disability Pain remains the main problem in this group, but activities of daily living are affected. These patients require a detailed investigation  61-80% Crippled Back pain impinges on all aspects of the patient's life. Positive intervention is required  81-100% Bed-bound These patients are either bed-bound or exaggerating their symptoms  Bluford FORBES Zoe DELENA Karon DELENA, et al. Surgery versus conservative management of stable thoracolumbar fracture: the PRESTO feasibility RCT. [place] ([place]): [employer]; 2021 Nov. [facility] Technology Assessment, No. 25.62.) Appendix 3, Oswestry Disability Index category descriptors. Available from: Findjewelers.cz  Minimally Clinically Important Difference (MCID) = 12.8%  NDI:  NECK DISABILITY INDEX  Date: 02/11/24 Score  Pain intensity 2 = The pain is moderate at the moment  2. Personal care (washing, dressing, etc.) 0 = I can look after myself normally without causing extra pain  3. Lifting 0 =  I can lift heavy weights without extra pain  4. Reading 1 = I can read as much as I want to with slight pain in my neck  5. Headaches 3 = I have moderate headaches, which come frequently  6. Concentration 1 =  I can concentrate fully when I want to with slight difficulty   7. Work 0 =  I can do as much work as I want to   8. Driving 1 =  I can drive my car as long as I want with slight pain in my neck  9. Sleeping 0 = I have no trouble sleeping  10.  Recreation 0 = I am able to engage in all my recreation activities with no neck pain at all  Total 8/50 = 16%   Minimum Detectable Change (90% confidence): 5 points or 10% points  COGNITION: Overall cognitive status: Within functional limits for tasks assessed     SENSATION: WFL  MUSCLE LENGTH: Hamstrings: Right 50 deg; Left 44 deg  POSTURE: rounded shoulders  PALPATION: Tightness noted to L>R upper traps and levator.  11/12: Concordant pain with lower trap and infraspinatus palpation as well on L shoulder   CERVICAL ROM:   Active ROM A/PROM (deg) eval 02/18/24  Flexion WFL 70 deg  Extension WFL 48 deg  Right lateral flexion WFL 50 deg  Left lateral flexion WFL 45 deg  Right rotation WFL 60 deg  Left rotation WFL 60 deg   (Blank rows = not tested) JOINT MOBILITY: Cervical UPA's C2-C7: hypomobile and concordant pain. Reproduces median nerve N/T in palmar aspect of L hand and digits 1-3 at C5-C6 region  Cervical CPA's C2-C7: hypomobile and concordant pain. Reproduces median nerve N/T in palmar aspect of L hand and digits 1-3 at C5-C6 region Cervical R/L side glides C2-C7: hypomobile and painless on R side. Hypomobile and concordant pain with all segments. Reproduces median nerve N/T in palmar aspect of L hand and digits 1-3 at C5-C6 region.  ULTT: LUE Median: positive  Radial: negative  Ulnar: negative   SHOULDER AROM: Flexion/extension WNL bilat and painless  LUMBAR ROM:   AROM eval  Flexion WFL  Extension WFL  Right lateral flexion WFL  Left lateral flexion WFL  Right rotation WFL  Left rotation WFL   (Blank rows = not tested)  UPPER EXTREMITY MMT:  MMT Right eval Left eval Right 11/12 Left  11/12  Shoulder flexion 4 4    Shoulder extension      Shoulder abduction 4 4    Shoulder adduction      Shoulder extension   4*  4  Shoulder internal rotation      Shoulder external rotation      Middle trapezius   5* 5  Lower trapezius   4* 4  Elbow flexion 4+ 4+    Elbow extension 4+ 4+    Wrist flexion      Wrist extension      Wrist ulnar deviation      Wrist radial deviation      Wrist pronation      Wrist supination      Grip strength       (Blank rows = not tested)   LOWER EXTREMITY MMT:    MMT Right eval Left eval  Hip flexion 4 4  Hip extension    Hip abduction 4 4  Hip adduction 4+ 4+  Hip internal rotation    Hip external rotation    Knee flexion 4+ 4+  Knee extension 4+ 4+  Ankle dorsiflexion 4+ 4+  Ankle plantarflexion    Ankle inversion    Ankle eversion     (Blank rows = not tested)  LUMBAR SPECIAL TESTS:  Straight leg raise test: Positive on L and FABER test: Negative  CERVICAL SPECIAL TESTS:   Spurling's 02/18/24: Negative A and B for L sided symptoms ; Distraction: positive - relieves L sided neck and arm symptoms     GAIT: Distance walked: 51' Assistive device utilized: None Level of assistance: Complete Independence Comments: no obvious gait deviations appreciated   TREATMENT DATE: 02/18/24  Physical Performance:   Cervical ROM measurements above   Spurling's A and B: Negative  Periscapular muscle testing see above   Joint mobility: see above  There.ex:  Reviewed prior HEP provided at eval. Updated with additional cervical retraction x12. Some peripheralizatoin of LUE pain by end of 12 reps. Provied HEP with education on centralization versus peripheralization. Encouraged 3x6 reps compared to 12 to see if reduced repetition eliminates peripheralizing symptoms. Educated pt to stop exercise if pain worsens and peripheralizes. Pt understanding.   Discussed sleeping posture and benefits of pain free exercise for recreation.    PATIENT  EDUCATION:  Education details: HEP, POC, goals  Person educated: Patient Education method: Explanation, Demonstration, and Handouts Education comprehension: verbalized understanding and returned demonstration  HOME EXERCISE PROGRAM: Access Code: [email or web address] URL: [email or web address] Date: 02/18/2024 Prepared by: Dorina Kingfisher  Exercises - Seated Cervical Retraction  - 1 x daily - 7 x weekly - 3 sets - 6 reps - Seated Hamstring Stretch  - 1-2 x daily - 5-7 x weekly - 3-5 reps - 30-60 seconds  hold - Seated Piriformis Stretch  - 1-2 x daily - 5-7 x weekly - 3-5 reps - 30-60 seconds hold - Seated Upper Trapezius Stretch  - 1-2 x daily - 5-7 x weekly - 3-5 reps - 30-60 seconds hold - Seated Levator Scapulae Stretch  - 1-2 x daily - 5-7 x weekly - 3-5 reps - 30-60 seconds  hold  Access Code: [email or web address] URL: [email or web address] Date: 02/11/2024 Prepared by: Maryanne Finder  Exercises - Seated Hamstring Stretch  - 1-2 x daily - 5-7 x weekly - 3-5 reps - 30-60 seconds  hold - Seated Piriformis Stretch  - 1-2 x daily - 5-7 x weekly - 3-5 reps - 30-60 seconds hold - Seated Upper Trapezius Stretch  - 1-2 x daily - 5-7 x weekly - 3-5 reps - 30-60 seconds hold - Seated Levator Scapulae Stretch  - 1-2 x daily - 5-7 x weekly - 3-5 reps - 30-60 seconds  hold  ASSESSMENT:  CLINICAL IMPRESSION: Pt arriving for first treatment session. Further tests and measures completed to assist in PT diagnosis. Pt with positive CPR cluster testing for L sided cervical radiculopathy for median nerve distribution and LUE symptoms reproduced with cervical CPA's/UPA's and side glide facet narrowing at C5-C6 level. Updated HEP to include repeated cervical retractions to assess peripheral versus centralization of symptoms. Educated pt on when to progress or end exercise depending on peripheralization or centralization of symptoms. Pt understanding. Patient will benefit from skilled PT services to  address listed impairments to improve quality of life and reduce pain.   OBJECTIVE IMPAIRMENTS: decreased activity tolerance, decreased endurance, difficulty walking, decreased ROM, decreased strength, impaired flexibility, postural dysfunction, and pain.   ACTIVITY LIMITATIONS: carrying, lifting, sleeping, and locomotion level  PARTICIPATION LIMITATIONS: cleaning, community activity, and yard work  PERSONAL FACTORS: Age, Behavior pattern, and Time since onset of injury/illness/exacerbation are also affecting patient's functional outcome.   REHAB POTENTIAL: Good  CLINICAL DECISION MAKING: Evolving/moderate complexity  EVALUATION COMPLEXITY: Moderate   GOALS: Goals reviewed with patient? Yes  SHORT TERM GOALS: Target date: 03/10/2024  Patient will be independent in HEP to improve strength/mobility for better functional independence with ADLs. Baseline: Goal status: INITIAL   LONG TERM GOALS: Target date: 04/07/2024  Patient will reduce Neck Disability Index score to <10% to demonstrate minimal disability with ADL's including improved sleeping tolerance, sitting tolerance, etc for better mobility at home and work. Baseline: 02/11/24: 8/50 = 16%  Goal status: INITIAL  2.  Patient will report decrease in frequency of headaches to <2/week to demonstrate improved muscle tightness of upper traps and levator and improve tolerance to activity without frequent headaches.  Baseline: 02/11/24: frequent (daily) Goal status: INITIAL  3.  Patient will improve B UE/LE strength by 1/3 MMT grade to demonstrate improved functional strength to be able to complete ADLs and work duties.  Baseline: 02/11/24: see above  Goal status: INITIAL  4.  Patient will report a worst pain of 3/10 on NRPS in neck and low back to improve tolerance with ADLs and reduced symptoms with activities.  Baseline: 02/11/24: 10/10 pain Goal status: INITIAL   PLAN:  PT FREQUENCY: 1-2x/week  PT DURATION: 8  weeks  PLANNED INTERVENTIONS: 97164- PT Re-evaluation, 97750- Physical Performance Testing, 97110-Therapeutic exercises, 97530- Therapeutic activity, 97112- Neuromuscular re-education, 97535- Self Care, [id number]- Manual therapy, [phone]- Gait training, [phone]- Electrical stimulation (unattended), [phone]- Electrical stimulation (manual), [phone] (1-2 muscles), [phone] (3+ muscles)- Dry Needling, Patient/Family education, Taping, Joint mobilization, Joint manipulation, Spinal manipulation, Spinal mobilization, Cryotherapy, and Moist heat.  PLAN FOR NEXT SESSION: Median nerve floss, periscapular strengthening.   Dorina HERO. Fairly IV, PT, DPT Physical Therapist- [place]  [facility] 02/18/2024, 11:28 AM

## 2024-02-19 ENCOUNTER — Ambulatory Visit: Admitting: Physical Therapy

## 2024-02-24 ENCOUNTER — Ambulatory Visit

## 2024-02-24 ENCOUNTER — Encounter: Payer: Self-pay | Admitting: Physical Therapy

## 2024-02-24 DIAGNOSIS — M5459 Other low back pain: Secondary | ICD-10-CM

## 2024-02-24 DIAGNOSIS — M6281 Muscle weakness (generalized): Secondary | ICD-10-CM | POA: Diagnosis not present

## 2024-02-24 DIAGNOSIS — M542 Cervicalgia: Secondary | ICD-10-CM

## 2024-02-24 NOTE — Therapy (Signed)
 OUTPATIENT PHYSICAL THERAPY THORACOLUMBAR TREATMENT   Patient Name: Torell Minder MRN: [id number] DOB:[birth year], 43 y.o., male Today's Date: 02/24/2024  END OF SESSION:  PT End of Session - 02/24/24 1122     Visit Number 3    Number of Visits 17    Date for Recertification  04/07/24    PT Start Time 1122    PT Stop Time 1200    PT Time Calculation (min) 38 min    Activity Tolerance Patient tolerated treatment well    Behavior During Therapy [facility] for tasks assessed/performed          Past Medical History:  Diagnosis Date   High cholesterol    Hypertension    Past Surgical History:  Procedure Laterality Date   ESOPHAGOGASTRODUODENOSCOPY (EGD) WITH PROPOFOL  N/A 01/30/2015   Procedure: ESOPHAGOGASTRODUODENOSCOPY (EGD) WITH PROPOFOL ;  Surgeon: Gladis RAYMOND Mariner, MD;  Location: [facility] ENDOSCOPY;  Service: Endoscopy;  Laterality: N/A;   Patient Active Problem List   Diagnosis Date Noted   Abdominal pain 12/22/2023   Acute low back pain with sciatica 12/22/2023   Type 2 diabetes mellitus without complication, without long-term current use of insulin (HCC) 08/05/2023   Mixed hyperlipidemia 08/05/2023    PCP: Albina GORMAN Dine, MD   REFERRING PROVIDER: Carin Gauze, NP  REFERRING DIAG:  M54.40 (ICD-10-CM) - Acute low back pain with sciatica, sciatica laterality unspecified, unspecified back pain laterality  Rationale for Evaluation and Treatment: Rehabilitation  THERAPY DIAG:  Muscle weakness (generalized)  Cervicalgia  Other low back pain  ONSET DATE: 1 year ago   SUBJECTIVE:                                                                                                                                                                                           SUBJECTIVE STATEMENT: Pt states pain in LUE and neck is intermittently better since last session. Pt reports onset of R sided head ache in rams head distribution since last session. 5/10 NPS with the head  ache. Reports having chronic migraines and this is similar pattern/distribution likely aggravated from last session.     PERTINENT HISTORY:  Per NP note on 12/22/23, patient reports lower back pain, occasional numbness and tingling. Per telephone note in chart, patient also complaining of severe neck pain.   PAIN:  Are you having pain? Yes: NPRS scale: 5/10 current; worst 10/10  Pain location: L shoulder/arm  Pain description: aching  Aggravating factors: unsure, random  Relieving factors: heat  PRECAUTIONS: None  RED FLAGS: None   WEIGHT BEARING RESTRICTIONS: No  FALLS:  Has patient fallen in last 6 months? No  LIVING  ENVIRONMENT: Lives with: lives with their family Lives in: House/apartment  OCCUPATION: Biomedical scientist   PLOF: Independent  PATIENT GOALS: to get rid of this pain   OBJECTIVE:  Note: Objective measures were completed at Evaluation unless otherwise noted.  DIAGNOSTIC FINDINGS:  EXAM: LUMBAR SPINE - COMPLETE 4+ VIEW IMPRESSION: 1. Mild lumbar spondylosis with lower lumbar facet spurring. 2. Moderate fecal stasis. 3. No evidence of fractures.  PATIENT SURVEYS:  Modified Oswestry:  MODIFIED OSWESTRY DISABILITY SCALE  Date: 02/11/24 Score  Pain intensity 3 =  Pain medication provides me with moderate relief from pain.  2. Personal care (washing, dressing, etc.) 0 =  I can take care of myself normally without causing increased pain.  3. Lifting 0 = I can lift heavy weights without increased pain.  4. Walking 0 = Pain does not prevent me from walking any distance  5. Sitting 0 =  I can sit in any chair as long as I like.  6. Standing 0 =  I can stand as long as I want without increased pain.  7. Sleeping 1 = I can sleep well only by using pain medication.  8. Social Life 0 = My social life is normal and does not increase my pain.  9. Traveling 0 =  I can travel anywhere without increased pain.  10. Employment/ Homemaking 0 = My normal homemaking/job  activities do not cause pain.  Total 4/50 = 8%   Interpretation of scores: Score Category Description  0-20% Minimal Disability The patient can cope with most living activities. Usually no treatment is indicated apart from advice on lifting, sitting and exercise  21-40% Moderate Disability The patient experiences more pain and difficulty with sitting, lifting and standing. Travel and social life are more difficult and they may be disabled from work. Personal care, sexual activity and sleeping are not grossly affected, and the patient can usually be managed by conservative means  41-60% Severe Disability Pain remains the main problem in this group, but activities of daily living are affected. These patients require a detailed investigation  61-80% Crippled Back pain impinges on all aspects of the patient's life. Positive intervention is required  81-100% Bed-bound These patients are either bed-bound or exaggerating their symptoms  Bluford FORBES Zoe DELENA Karon DELENA, et al. Surgery versus conservative management of stable thoracolumbar fracture: the PRESTO feasibility RCT. [place] ([place]): [employer]; 2021 Nov. [facility] Technology Assessment, No. 25.62.) Appendix 3, Oswestry Disability Index category descriptors. Available from: Findjewelers.cz  Minimally Clinically Important Difference (MCID) = 12.8%  NDI:  NECK DISABILITY INDEX  Date: 02/11/24 Score  Pain intensity 2 = The pain is moderate at the moment  2. Personal care (washing, dressing, etc.) 0 = I can look after myself normally without causing extra pain  3. Lifting 0 =  I can lift heavy weights without extra pain  4. Reading 1 = I can read as much as I want to with slight pain in my neck  5. Headaches 3 = I have moderate headaches, which come frequently  6. Concentration 1 =  I can concentrate fully when I want to with slight difficulty   7. Work 0 =  I can do as much work as I want to  8. Driving 1 =   I can drive my car as long as I want with slight pain in my neck  9. Sleeping 0 = I have no trouble sleeping  10. Recreation 0 = I am able to engage in  all my recreation activities with no neck pain at all  Total 8/50 = 16%   Minimum Detectable Change (90% confidence): 5 points or 10% points  COGNITION: Overall cognitive status: Within functional limits for tasks assessed     SENSATION: WFL  MUSCLE LENGTH: Hamstrings: Right 50 deg; Left 44 deg  POSTURE: rounded shoulders  PALPATION: Tightness noted to L>R upper traps and levator.  11/12: Concordant pain with lower trap and infraspinatus palpation as well on L shoulder   CERVICAL ROM:   Active ROM A/PROM (deg) eval 02/18/24  Flexion WFL 70 deg  Extension WFL 48 deg  Right lateral flexion WFL 50 deg  Left lateral flexion WFL 45 deg  Right rotation WFL 60 deg  Left rotation WFL 60 deg   (Blank rows = not tested) JOINT MOBILITY: Cervical UPA's C2-C7: hypomobile and concordant pain. Reproduces median nerve N/T in palmar aspect of L hand and digits 1-3 at C5-C6 region  Cervical CPA's C2-C7: hypomobile and concordant pain. Reproduces median nerve N/T in palmar aspect of L hand and digits 1-3 at C5-C6 region Cervical R/L side glides C2-C7: hypomobile and painless on R side. Hypomobile and concordant pain with all segments. Reproduces median nerve N/T in palmar aspect of L hand and digits 1-3 at C5-C6 region.  ULTT: LUE Median: positive  Radial: negative  Ulnar: negative   SHOULDER AROM: Flexion/extension WNL bilat and painless  LUMBAR ROM:   AROM eval  Flexion WFL  Extension WFL  Right lateral flexion WFL  Left lateral flexion WFL  Right rotation WFL  Left rotation WFL   (Blank rows = not tested)  UPPER EXTREMITY MMT:  MMT Right eval Left eval Right 11/12 Left  11/12  Shoulder flexion 4 4    Shoulder extension      Shoulder abduction 4 4    Shoulder adduction      Shoulder extension   4* 4  Shoulder  internal rotation      Shoulder external rotation      Middle trapezius   5* 5  Lower trapezius   4* 4  Elbow flexion 4+ 4+    Elbow extension 4+ 4+    Wrist flexion      Wrist extension      Wrist ulnar deviation      Wrist radial deviation      Wrist pronation      Wrist supination      Grip strength       (Blank rows = not tested)   LOWER EXTREMITY MMT:    MMT Right eval Left eval  Hip flexion 4 4  Hip extension    Hip abduction 4 4  Hip adduction 4+ 4+  Hip internal rotation    Hip external rotation    Knee flexion 4+ 4+  Knee extension 4+ 4+  Ankle dorsiflexion 4+ 4+  Ankle plantarflexion    Ankle inversion    Ankle eversion     (Blank rows = not tested)  LUMBAR SPECIAL TESTS:  Straight leg raise test: Positive on L and FABER test: Negative  CERVICAL SPECIAL TESTS:   Spurling's 02/18/24: Negative A and B for L sided symptoms ; Distraction: positive - relieves L sided neck and arm symptoms     GAIT: Distance walked: 64' Assistive device utilized: None Level of assistance: Complete Independence Comments: no obvious gait deviations appreciated   TREATMENT DATE: 02/24/24  Manual Therapy: 10 minutes in supine for improved cervical pain and AROM  Cervical manual traction: 3x30 sec  Suboccipital release: 3x30 sec   Side glides: L side, grade 4 C2-C7, 3x10sec bouts/segment     There.ex:  Median nerve glide LUE:  x10. PT demo with min multi modal cuing. Good carryover post education/cues.   Cervical rotation SNAGS with towel: x10 R/L. Min TC's for set up with good carryover.  Standing TB exercises:    Scap retractions with blue TB: 2x10    Low row/shoulder extension. Blue TB reproduces LUE radicular pain. No pain with GTB x10.    Updated HEP to assist in cervical ROM, radicular symptoms, and initiate periscapular strengthening.  Educated on reps/sets/frequency.   PATIENT EDUCATION:  Education details: HEP, POC, goals  Person educated: Patient Education method: Explanation, Demonstration, and Handouts Education comprehension: verbalized understanding and returned demonstration  HOME EXERCISE PROGRAM: Access Code: [email or web address] URL: [email or web address] Date: 02/24/2024 Prepared by: Dorina Kingfisher  Exercises - Seated Cervical Retraction  - 1 x daily - 7 x weekly - 3 sets - 6 reps - Seated Hamstring Stretch  - 1-2 x daily - 5-7 x weekly - 3-5 reps - 30-60 seconds  hold - Seated Piriformis Stretch  - 1-2 x daily - 5-7 x weekly - 3-5 reps - 30-60 seconds hold - Seated Upper Trapezius Stretch  - 1-2 x daily - 5-7 x weekly - 3-5 reps - 30-60 seconds hold - Seated Levator Scapulae Stretch  - 1-2 x daily - 5-7 x weekly - 3-5 reps - 30-60 seconds  hold - Seated Assisted Cervical Rotation with Towel  - 1 x daily - 7 x weekly - 3 sets - 10 reps - Median Nerve Flossing - Tray  - 1 x daily - 7 x weekly - 2 sets - 8 reps - Scapular Retraction with Resistance  - 1 x daily - 3 x weekly - 3 sets - 15 reps - Scapular Retraction with Resistance Advanced  - 1 x daily - 3 x weekly - 3 sets - 15 reps   Access Code: [email or web address] URL: [email or web address] Date: 02/18/2024 Prepared by: Dorina Kingfisher  Exercises - Seated Cervical Retraction  - 1 x daily - 7 x weekly - 3 sets - 6 reps - Seated Hamstring Stretch  - 1-2 x daily - 5-7 x weekly - 3-5 reps - 30-60 seconds  hold - Seated Piriformis Stretch  - 1-2 x daily - 5-7 x weekly - 3-5 reps - 30-60 seconds hold - Seated Upper Trapezius Stretch  - 1-2 x daily - 5-7 x weekly - 3-5 reps - 30-60 seconds hold - Seated Levator Scapulae Stretch  - 1-2 x daily - 5-7 x weekly - 3-5 reps - 30-60 seconds  hold  Access Code: [email or web address] URL: [email or web address] Date: 02/11/2024 Prepared by: Maryanne Finder  Exercises - Seated Hamstring Stretch  - 1-2 x  daily - 5-7 x weekly - 3-5 reps - 30-60 seconds  hold - Seated Piriformis Stretch  - 1-2 x daily - 5-7 x weekly - 3-5 reps - 30-60 seconds hold - Seated Upper Trapezius Stretch  - 1-2 x daily - 5-7 x weekly - 3-5 reps - 30-60 seconds hold - Seated Levator Scapulae Stretch  - 1-2 x daily - 5-7 x weekly - 3-5 reps - 30-60 seconds  hold  ASSESSMENT:  CLINICAL IMPRESSION: Continuing PT POC working on manual interventions for pain relief and improved cervical mobility with facet narrowing ranges that lead to reproduction of  symptoms in LUE. Able to follow up with therapeutic exercises for periscapular strength and cervical mobility with improved symptoms by end of session. Updated HEP to reflect periscapular strengthening and cerivcal mobility/median nerve floss. Patient will benefit from skilled PT services to address listed impairments to improve quality of life and reduce pain.   OBJECTIVE IMPAIRMENTS: decreased activity tolerance, decreased endurance, difficulty walking, decreased ROM, decreased strength, impaired flexibility, postural dysfunction, and pain.   ACTIVITY LIMITATIONS: carrying, lifting, sleeping, and locomotion level  PARTICIPATION LIMITATIONS: cleaning, community activity, and yard work  PERSONAL FACTORS: Age, Behavior pattern, and Time since onset of injury/illness/exacerbation are also affecting patient's functional outcome.   REHAB POTENTIAL: Good  CLINICAL DECISION MAKING: Evolving/moderate complexity  EVALUATION COMPLEXITY: Moderate   GOALS: Goals reviewed with patient? Yes  SHORT TERM GOALS: Target date: 03/10/2024  Patient will be independent in HEP to improve strength/mobility for better functional independence with ADLs. Baseline: Goal status: INITIAL   LONG TERM GOALS: Target date: 04/07/2024  Patient will reduce Neck Disability Index score to <10% to demonstrate minimal disability with ADL's including improved sleeping tolerance, sitting tolerance, etc for  better mobility at home and work. Baseline: 02/11/24: 8/50 = 16% Goal status: INITIAL  2.  Patient will report decrease in frequency of headaches to <2/week to demonstrate improved muscle tightness of upper traps and levator and improve tolerance to activity without frequent headaches.  Baseline: 02/11/24: frequent (daily) Goal status: INITIAL  3.  Patient will improve B UE/LE strength by 1/3 MMT grade to demonstrate improved functional strength to be able to complete ADLs and work duties.  Baseline: 02/11/24: see above  Goal status: INITIAL  4.  Patient will report a worst pain of 3/10 on NRPS in neck and low back to improve tolerance with ADLs and reduced symptoms with activities.  Baseline: 02/11/24: 10/10 pain Goal status: INITIAL   PLAN:  PT FREQUENCY: 1-2x/week  PT DURATION: 8 weeks  PLANNED INTERVENTIONS: 97164- PT Re-evaluation, 97750- Physical Performance Testing, 97110-Therapeutic exercises, 97530- Therapeutic activity, 97112- Neuromuscular re-education, 97535- Self Care, [id number]- Manual therapy, [phone]- Gait training, [phone]- Electrical stimulation (unattended), [phone]- Electrical stimulation (manual), [phone] (1-2 muscles), [id number] (3+ muscles)- Dry Needling, Patient/Family education, Taping, Joint mobilization, Joint manipulation, Spinal manipulation, Spinal mobilization, Cryotherapy, and Moist heat.  PLAN FOR NEXT SESSION: Median nerve floss review in LUE along with cervical rotation SNAGS. periscapular strengthening.   Dorina HERO. Fairly IV, PT, DPT Physical Therapist- [place]  [facility] 02/24/2024, 12:15 PM

## 2024-02-25 ENCOUNTER — Ambulatory Visit: Admitting: Physical Therapy

## 2024-03-02 ENCOUNTER — Ambulatory Visit: Admitting: Physical Therapy

## 2024-03-08 ENCOUNTER — Ambulatory Visit: Admitting: Physical Therapy

## 2024-03-10 ENCOUNTER — Encounter: Payer: Self-pay | Admitting: Physical Therapy

## 2024-03-10 ENCOUNTER — Ambulatory Visit: Attending: Cardiology | Admitting: Physical Therapy

## 2024-03-10 DIAGNOSIS — M542 Cervicalgia: Secondary | ICD-10-CM | POA: Insufficient documentation

## 2024-03-10 DIAGNOSIS — M5459 Other low back pain: Secondary | ICD-10-CM | POA: Insufficient documentation

## 2024-03-10 DIAGNOSIS — M6281 Muscle weakness (generalized): Secondary | ICD-10-CM | POA: Diagnosis present

## 2024-03-10 LAB — SPECIMEN STATUS REPORT

## 2024-03-10 LAB — COMPREHENSIVE METABOLIC PANEL WITH GFR
ALT: 18 IU/L (ref 0–44)
AST: 16 IU/L (ref 0–40)
Albumin: 4.3 g/dL (ref 4.1–5.1)
Alkaline Phosphatase: 76 IU/L (ref 47–123)
BUN/Creatinine Ratio: 17 (ref 9–20)
BUN: 17 mg/dL (ref 6–24)
Bilirubin Total: 0.3 mg/dL (ref 0.0–1.2)
CO2: 21 mmol/L (ref 20–29)
Calcium: 9.3 mg/dL (ref 8.7–10.2)
Chloride: 103 mmol/L (ref 96–106)
Creatinine, Ser: 1.01 mg/dL (ref 0.76–1.27)
Globulin, Total: 2.6 g/dL (ref 1.5–4.5)
Glucose: 96 mg/dL (ref 70–99)
Potassium: 4.3 mmol/L (ref 3.5–5.2)
Sodium: 140 mmol/L (ref 134–144)
Total Protein: 6.9 g/dL (ref 6.0–8.5)
eGFR: 95 mL/min/1.73 (ref >59)

## 2024-03-10 NOTE — Therapy (Signed)
 OUTPATIENT PHYSICAL THERAPY THORACOLUMBAR TREATMENT   Patient Name: Daniel Hudson MRN: [id number] DOB:[birth year], 43 y.o., male Today's Date: 03/10/2024  END OF SESSION:  PT End of Session - 03/10/24 1440     Visit Number 4    Number of Visits 17    Date for Recertification  04/07/24    Authorization Type 11/5 - 04/09/24  auth# [id number]    Authorization - Visit Number 4    Authorization - Number of Visits 8    Progress Note Due on Visit 4    PT Start Time 1430    PT Stop Time 1515    PT Time Calculation (min) 45 min    Activity Tolerance Patient tolerated treatment well    Behavior During Therapy [facility] for tasks assessed/performed          Past Medical History:  Diagnosis Date   High cholesterol    Hypertension    Past Surgical History:  Procedure Laterality Date   ESOPHAGOGASTRODUODENOSCOPY (EGD) WITH PROPOFOL  N/A 01/30/2015   Procedure: ESOPHAGOGASTRODUODENOSCOPY (EGD) WITH PROPOFOL ;  Surgeon: Gladis RAYMOND Mariner, MD;  Location: [facility] ENDOSCOPY;  Service: Endoscopy;  Laterality: N/A;   Patient Active Problem List   Diagnosis Date Noted   Abdominal pain 12/22/2023   Acute low back pain with sciatica 12/22/2023   Type 2 diabetes mellitus without complication, without long-term current use of insulin (HCC) 08/05/2023   Mixed hyperlipidemia 08/05/2023    PCP: Albina GORMAN Dine, MD   REFERRING PROVIDER: Carin Gauze, NP  REFERRING DIAG:  M54.40 (ICD-10-CM) - Acute low back pain with sciatica, sciatica laterality unspecified, unspecified back pain laterality  Rationale for Evaluation and Treatment: Rehabilitation  THERAPY DIAG:  Muscle weakness (generalized)  Cervicalgia  Other low back pain  ONSET DATE: 1 year ago   SUBJECTIVE:                                                                                                                                                                                           SUBJECTIVE STATEMENT: Pt reports ongoing n/t  in both hands with pain also localized to left arm. 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    PERTINENT HISTORY:  Per NP note on 12/22/23, patient reports lower back pain, occasional numbness and tingling. Per telephone note in chart, patient also complaining of severe neck pain.   PAIN:  Are you having pain? Yes: NPRS scale: 4/10 current; worst 10/10  Pain location: L shoulder/arm, low back pain  Pain description: aching  Aggravating factors:  unsure, random  Relieving factors: heat  PRECAUTIONS: None  RED FLAGS: None   WEIGHT BEARING RESTRICTIONS: No  FALLS:  Has patient fallen in last 6 months? No  LIVING ENVIRONMENT: Lives with: lives with their family Lives in: House/apartment  OCCUPATION: Biomedical scientist   PLOF: Independent  PATIENT GOALS: to get rid of this pain   OBJECTIVE:  Note: Objective measures were completed at Evaluation unless otherwise noted.  DIAGNOSTIC FINDINGS:  EXAM: LUMBAR SPINE - COMPLETE 4+ VIEW IMPRESSION: 1. Mild lumbar spondylosis with lower lumbar facet spurring. 2. Moderate fecal stasis. 3. No evidence of fractures.  PATIENT SURVEYS:  Modified Oswestry:  MODIFIED OSWESTRY DISABILITY SCALE  Date: 02/11/24 Score  Pain intensity 3 =  Pain medication provides me with moderate relief from pain.  2. Personal care (washing, dressing, etc.) 0 =  I can take care of myself normally without causing increased pain.  3. Lifting 0 = I can lift heavy weights without increased pain.  4. Walking 0 = Pain does not prevent me from walking any distance  5. Sitting 0 =  I can sit in any chair as long as I like.  6. Standing 0 =  I can stand as long as I want without increased pain.  7. Sleeping 1 = I can sleep well only by using pain medication.  8. Social Life 0 = My social life is normal and does not increase my pain.  9. Traveling 0 =  I can travel  anywhere without increased pain.  10. Employment/ Homemaking 0 = My normal homemaking/job activities do not cause pain.  Total 4/50 = 8%   Interpretation of scores: Score Category Description  0-20% Minimal Disability The patient can cope with most living activities. Usually no treatment is indicated apart from advice on lifting, sitting and exercise  21-40% Moderate Disability The patient experiences more pain and difficulty with sitting, lifting and standing. Travel and social life are more difficult and they may be disabled from work. Personal care, sexual activity and sleeping are not grossly affected, and the patient can usually be managed by conservative means  41-60% Severe Disability Pain remains the main problem in this group, but activities of daily living are affected. These patients require a detailed investigation  61-80% Crippled Back pain impinges on all aspects of the patient's life. Positive intervention is required  81-100% Bed-bound These patients are either bed-bound or exaggerating their symptoms  Bluford FORBES Zoe DELENA Karon DELENA, et al. Surgery versus conservative management of stable thoracolumbar fracture: the PRESTO feasibility RCT. [place] ([place]): [employer]; 2021 Nov. [facility] Technology Assessment, No. 25.62.) Appendix 3, Oswestry Disability Index category descriptors. Available from: Findjewelers.cz  Minimally Clinically Important Difference (MCID) = 12.8%  NDI:  NECK DISABILITY INDEX  Date: 02/11/24 Score  Pain intensity 2 = The pain is moderate at the moment  2. Personal care (washing, dressing, etc.) 0 = I can look after myself normally without causing extra pain  3. Lifting 0 =  I can lift heavy weights without extra pain  4. Reading 1 = I can read as much as I want to with slight pain in my neck  5. Headaches 3 = I have moderate headaches, which come frequently  6. Concentration 1 =  I can concentrate fully when I want to  with slight difficulty   7. Work 0 =  I can do as much work as I want to  8. Driving 1 =  I can drive my car  as long as I want with slight pain in my neck  9. Sleeping 0 = I have no trouble sleeping  10. Recreation 0 = I am able to engage in all my recreation activities with no neck pain at all  Total 8/50 = 16%   Minimum Detectable Change (90% confidence): 5 points or 10% points  COGNITION: Overall cognitive status: Within functional limits for tasks assessed     SENSATION: WFL  MUSCLE LENGTH: Hamstrings: Right 50 deg; Left 44 deg  POSTURE: rounded shoulders  PALPATION: Tightness noted to L>R upper traps and levator.  11/12: Concordant pain with lower trap and infraspinatus palpation as well on L shoulder   CERVICAL ROM:   Active ROM A/PROM (deg) eval 02/18/24  Flexion WFL 70 deg  Extension WFL 48 deg  Right lateral flexion WFL 50 deg  Left lateral flexion WFL 45 deg  Right rotation WFL 60 deg  Left rotation WFL 60 deg   (Blank rows = not tested) JOINT MOBILITY: Cervical UPA's C2-C7: hypomobile and concordant pain. Reproduces median nerve N/T in palmar aspect of L hand and digits 1-3 at C5-C6 region  Cervical CPA's C2-C7: hypomobile and concordant pain. Reproduces median nerve N/T in palmar aspect of L hand and digits 1-3 at C5-C6 region Cervical R/L side glides C2-C7: hypomobile and painless on R side. Hypomobile and concordant pain with all segments. Reproduces median nerve N/T in palmar aspect of L hand and digits 1-3 at C5-C6 region.  ULTT: LUE Median: positive  Radial: negative  Ulnar: negative   SHOULDER AROM: Flexion/extension WNL bilat and painless  LUMBAR ROM:   AROM eval  Flexion WFL  Extension WFL  Right lateral flexion WFL  Left lateral flexion WFL  Right rotation WFL  Left rotation WFL   (Blank rows = not tested)  UPPER EXTREMITY MMT:  MMT Right eval Left eval Right 11/12 Left  11/12  Shoulder flexion 4 4    Shoulder extension       Shoulder abduction 4 4    Shoulder adduction      Shoulder extension   4* 4  Shoulder internal rotation      Shoulder external rotation      Middle trapezius   5* 5  Lower trapezius   4* 4  Elbow flexion 4+ 4+    Elbow extension 4+ 4+    Wrist flexion      Wrist extension      Wrist ulnar deviation      Wrist radial deviation      Wrist pronation      Wrist supination      Grip strength       (Blank rows = not tested)   LOWER EXTREMITY MMT:    MMT Right eval Left eval  Hip flexion 4 4  Hip extension    Hip abduction 4 4  Hip adduction 4+ 4+  Hip internal rotation    Hip external rotation    Knee flexion 4+ 4+  Knee extension 4+ 4+  Ankle dorsiflexion 4+ 4+  Ankle plantarflexion    Ankle inversion    Ankle eversion     (Blank rows = not tested)  LUMBAR SPECIAL TESTS:  Straight leg raise test: Positive on L and FABER test: Negative  CERVICAL SPECIAL TESTS:   Spurling's 02/18/24: Negative A and B for L sided symptoms ; Distraction: positive - relieves L sided neck and arm symptoms     GAIT: Distance walked: 46' Assistive device utilized:  None Level of assistance: Complete Independence Comments: no obvious gait deviations appreciated   TREATMENT DATE:   03/10/24             THEREX   UBE with seat at 6 resistance at 2 - 2 min forward and 2 min backward    Lower Trap Setting Lift Off 1 x 10   Standing D2 Flexion/ Extension 2 x 10   -Pt reports increased pain localized to left posterior delt pain  Bent over Y's on LUE 1 x 10  -Pt reports increased pain localized to left posterior delt pain  Lat Pull Down with Extended elbow for lower trap activation # 20 2 x 10  Shoulder AROM: WFL for flexion and Abduction, crepitus noted in left shoulder  Shoulder PROM: WFL with no pain in left shoulder with abduction  Painful Arc: + on LUE   Lift Off with red band around hands 2 x 10     PATIENT EDUCATION:  Education details: HEP, POC, goals  Person educated:  Patient Education method: Explanation, Demonstration, and Handouts Education comprehension: verbalized understanding and returned demonstration  HOME EXERCISE PROGRAM: Access Code: [email or web address] URL: [email or web address] Date: 03/10/2024 Prepared by: Toribio Servant  Exercises - Seated Cervical Retraction  - 1 x daily - 7 x weekly - 3 sets - 6 reps - Seated Hamstring Stretch  - 1-2 x daily - 5-7 x weekly - 3-5 reps - 30-60 seconds  hold - Seated Piriformis Stretch  - 1-2 x daily - 5-7 x weekly - 3-5 reps - 30-60 seconds hold - Seated Upper Trapezius Stretch  - 1-2 x daily - 5-7 x weekly - 3-5 reps - 30-60 seconds hold - Seated Levator Scapulae Stretch  - 1-2 x daily - 5-7 x weekly - 3-5 reps - 30-60 seconds  hold - Seated Assisted Cervical Rotation with Towel  - 1 x daily - 7 x weekly - 3 sets - 10 reps - Median Nerve Flossing - Tray  - 1 x daily - 7 x weekly - 2 sets - 8 reps - Scapular Retraction with Resistance  - 1 x daily - 3 x weekly - 3 sets - 15 reps - Scapular Retraction with Resistance Advanced  - 1 x daily - 3 x weekly - 3 sets - 15 reps - Standing Low Trap Setting with Resistance at Wall  - 3-4 x weekly - 3 sets - 10 reps     ASSESSMENT:  CLINICAL IMPRESSION: Pt shows improvement with decrease in frequency of headaches and pain in left neck and low back. Despite improvements, his perception of neck function as worsened albeit it was high with initial score. Left shoulder pain does appear to be separate from beck pain with signs and symptoms of subacromial impingement versus shoulder OA with positive painful arch and pain that is localized over [facility] joint  and that worsens with overhead movements and that is accompanied by crepitus.  He will continue to benefit from skilled PT services to address listed impairments to improve quality of life and reduce pain.   OBJECTIVE IMPAIRMENTS: decreased activity tolerance, decreased endurance, difficulty walking, decreased ROM,  decreased strength, impaired flexibility, postural dysfunction, and pain.   ACTIVITY LIMITATIONS: carrying, lifting, sleeping, and locomotion level  PARTICIPATION LIMITATIONS: cleaning, community activity, and yard work  PERSONAL FACTORS: Age, Behavior pattern, and Time since onset of injury/illness/exacerbation are also affecting patient's functional outcome.   REHAB POTENTIAL: Good  CLINICAL DECISION MAKING: Evolving/moderate complexity  EVALUATION COMPLEXITY: Moderate  GOALS: Goals reviewed with patient? Yes  SHORT TERM GOALS: Target date: 03/10/2024  Patient will be independent in HEP to improve strength/mobility for better functional independence with ADLs. Baseline: NT  03/10/24: Performing independently    Goal status: ACHIEVED   LONG TERM GOALS: Target date: 04/07/2024  Patient will reduce Neck Disability Index score to <10% to demonstrate minimal disability with ADL's including improved sleeping tolerance, sitting tolerance, etc for better mobility at home and work. Baseline: 02/11/24: 8/50 = 16% 03/10/24: 13/50 (26%)   Goal status: NOT PROGRESSING    2.  Patient will report decrease in frequency of headaches to <2/week to demonstrate improved muscle tightness of upper traps and levator and improve tolerance to activity without frequent headaches.  Baseline: 02/11/24: frequent (daily) 03/10/24: 4-5x per week   Goal status: PROGRESSING    3.  Patient will improve B UE/LE strength by 1/3 MMT grade to demonstrate improved functional strength to be able to complete ADLs and work duties.  Baseline: 02/11/24: see above  Goal status: ONGOING   4.  Patient will report a worst pain of 3/10 on NRPS in neck and low back to improve tolerance with ADLs and reduced symptoms with activities.  Baseline: 02/11/24: 10/10 pain  03/10/24: 4/10 in low back and neck and left shoulder    Goal status: PROGRESSING     PLAN:  PT FREQUENCY: 1-2x/week  PT DURATION: 8 weeks  PLANNED  INTERVENTIONS: 97164- PT Re-evaluation, 97750- Physical Performance Testing, 97110-Therapeutic exercises, 97530- Therapeutic activity, 97112- Neuromuscular re-education, 97535- Self Care, [id number]- Manual therapy, [phone]- Gait training, [phone]- Electrical stimulation (unattended), [phone]- Electrical stimulation (manual), [phone] (1-2 muscles), [phone] (3+ muscles)- Dry Needling, Patient/Family education, Taping, Joint mobilization, Joint manipulation, Spinal manipulation, Spinal mobilization, Cryotherapy, and Moist heat.  PLAN FOR NEXT SESSION: Reassess shoulder and periscapular strength and progress periscapular strength and look at cervical and lumbar paraspinal musculature .  Toribio Servant PT, DPT  [facility] [address] [place], [address] Phone: [phone]   Fax:  [phone]

## 2024-03-15 ENCOUNTER — Ambulatory Visit: Admitting: Physical Therapy

## 2024-03-18 ENCOUNTER — Ambulatory Visit: Admitting: Physical Therapy

## 2024-03-23 ENCOUNTER — Ambulatory Visit: Admitting: Physical Therapy

## 2024-03-30 ENCOUNTER — Telehealth: Payer: Self-pay | Admitting: Physical Therapy

## 2024-03-30 ENCOUNTER — Ambulatory Visit: Admitting: Physical Therapy

## 2024-03-30 NOTE — Telephone Encounter (Signed)
 Called patient to inquire about absence from PT. Pt reports forgetting that he had an apt today and that he plans on being at next apt. PT reminded patient about attendance policy and to call in advance and if he has another no show he would be removed from schedule.

## 2024-04-06 ENCOUNTER — Encounter: Payer: Self-pay | Admitting: Physical Therapy

## 2024-04-06 ENCOUNTER — Ambulatory Visit: Admitting: Physical Therapy

## 2024-04-06 DIAGNOSIS — M542 Cervicalgia: Secondary | ICD-10-CM

## 2024-04-06 DIAGNOSIS — M6281 Muscle weakness (generalized): Secondary | ICD-10-CM | POA: Diagnosis not present

## 2024-04-06 NOTE — Therapy (Signed)
 " OUTPATIENT PHYSICAL THERAPY THORACOLUMBAR DISCHARGE     Patient Name: Daniel Hudson MRN: [id number] DOB:1980/09/07, 43 y.o., male Today's Date: 04/06/2024  END OF SESSION:  PT End of Session - 04/06/24 1526     Visit Number 5    Number of Visits 17    Date for Recertification  04/07/24    Authorization Type 11/5 - 04/09/24  auth# [id number]    Authorization - Visit Number 5    Authorization - Number of Visits 8    Progress Note Due on Visit 4    PT Start Time 1520    PT Stop Time 1600    PT Time Calculation (min) 40 min    Activity Tolerance Patient tolerated treatment well    Behavior During Therapy [facility] for tasks assessed/performed          Past Medical History:  Diagnosis Date   High cholesterol    Hypertension    Past Surgical History:  Procedure Laterality Date   ESOPHAGOGASTRODUODENOSCOPY (EGD) WITH PROPOFOL  N/A 01/30/2015   Procedure: ESOPHAGOGASTRODUODENOSCOPY (EGD) WITH PROPOFOL ;  Surgeon: Gladis RAYMOND Mariner, MD;  Location: [facility] ENDOSCOPY;  Service: Endoscopy;  Laterality: N/A;   Patient Active Problem List   Diagnosis Date Noted   Abdominal pain 12/22/2023   Acute low back pain with sciatica 12/22/2023   Type 2 diabetes mellitus without complication, without long-term current use of insulin (HCC) 08/05/2023   Mixed hyperlipidemia 08/05/2023    PCP: Albina GORMAN Dine, MD   REFERRING PROVIDER: Carin Gauze, NP  REFERRING DIAG:  M54.40 (ICD-10-CM) - Acute low back pain with sciatica, sciatica laterality unspecified, unspecified back pain laterality  Rationale for Evaluation and Treatment: Rehabilitation  THERAPY DIAG:  Cervicalgia  Muscle weakness (generalized)  ONSET DATE: 1 year ago   SUBJECTIVE:                                                                                                                                                                                           SUBJECTIVE STATEMENT: Pt reports marked improvement with a  decrease in neck pain and headaches. At this point, he only occasionally has low back pain when waking up in morning, but it does not stop him from doing anything.    PERTINENT HISTORY:  Per NP note on 12/22/23, patient reports lower back pain, occasional numbness and tingling. Per telephone note in chart, patient also complaining of severe neck pain.   PAIN:  Are you having pain? Yes: NPRS scale: 1-2/10 current   Pain location: L shoulder/arm, low back pain  Pain description: aching  Aggravating factors: unsure, random  Relieving factors: heat  PRECAUTIONS:  None  RED FLAGS: None   WEIGHT BEARING RESTRICTIONS: No  FALLS:  Has patient fallen in last 6 months? No  LIVING ENVIRONMENT: Lives with: lives with their family Lives in: House/apartment  OCCUPATION: Biomedical scientist   PLOF: Independent  PATIENT GOALS: to get rid of this pain   OBJECTIVE:  Note: Objective measures were completed at Evaluation unless otherwise noted.  DIAGNOSTIC FINDINGS:  EXAM: LUMBAR SPINE - COMPLETE 4+ VIEW IMPRESSION: 1. Mild lumbar spondylosis with lower lumbar facet spurring. 2. Moderate fecal stasis. 3. No evidence of fractures.  PATIENT SURVEYS:  Modified Oswestry:  MODIFIED OSWESTRY DISABILITY SCALE  Date: 02/11/24 Score  Pain intensity 3 =  Pain medication provides me with moderate relief from pain.  2. Personal care (washing, dressing, etc.) 0 =  I can take care of myself normally without causing increased pain.  3. Lifting 0 = I can lift heavy weights without increased pain.  4. Walking 0 = Pain does not prevent me from walking any distance  5. Sitting 0 =  I can sit in any chair as long as I like.  6. Standing 0 =  I can stand as long as I want without increased pain.  7. Sleeping 1 = I can sleep well only by using pain medication.  8. Social Life 0 = My social life is normal and does not increase my pain.  9. Traveling 0 =  I can travel anywhere without increased pain.  10.  Employment/ Homemaking 0 = My normal homemaking/job activities do not cause pain.  Total 4/50 = 8%   Interpretation of scores: Score Category Description  0-20% Minimal Disability The patient can cope with most living activities. Usually no treatment is indicated apart from advice on lifting, sitting and exercise  21-40% Moderate Disability The patient experiences more pain and difficulty with sitting, lifting and standing. Travel and social life are more difficult and they may be disabled from work. Personal care, sexual activity and sleeping are not grossly affected, and the patient can usually be managed by conservative means  41-60% Severe Disability Pain remains the main problem in this group, but activities of daily living are affected. These patients require a detailed investigation  61-80% Crippled Back pain impinges on all aspects of the patients life. Positive intervention is required  81-100% Bed-bound These patients are either bed-bound or exaggerating their symptoms  Bluford FORBES Zoe DELENA Karon DELENA, et al. Surgery versus conservative management of stable thoracolumbar fracture: the PRESTO feasibility RCT. [place] ([place]): [employer]; 2021 Nov. [facility] Technology Assessment, No. 25.62.) Appendix 3, Oswestry Disability Index category descriptors. Available from: Findjewelers.cz  Minimally Clinically Important Difference (MCID) = 12.8%  NDI:  NECK DISABILITY INDEX  Date: 02/11/24 Score  Pain intensity 2 = The pain is moderate at the moment  2. Personal care (washing, dressing, etc.) 0 = I can look after myself normally without causing extra pain  3. Lifting 0 =  I can lift heavy weights without extra pain  4. Reading 1 = I can read as much as I want to with slight pain in my neck  5. Headaches 3 = I have moderate headaches, which come frequently  6. Concentration 1 =  I can concentrate fully when I want to with slight difficulty   7. Work 0 =   I can do as much work as I want to  8. Driving 1 =  I can drive my car as long as I want with slight pain  in my neck  9. Sleeping 0 = I have no trouble sleeping  10. Recreation 0 = I am able to engage in all my recreation activities with no neck pain at all  Total 8/50 = 16%   Minimum Detectable Change (90% confidence): 5 points or 10% points  COGNITION: Overall cognitive status: Within functional limits for tasks assessed     SENSATION: WFL  MUSCLE LENGTH: Hamstrings: Right 50 deg; Left 44 deg  POSTURE: rounded shoulders  PALPATION: Tightness noted to L>R upper traps and levator.  11/12: Concordant pain with lower trap and infraspinatus palpation as well on L shoulder   CERVICAL ROM:   Active ROM A/PROM (deg) eval 02/18/24  Flexion WFL 70 deg  Extension WFL 48 deg  Right lateral flexion WFL 50 deg  Left lateral flexion WFL 45 deg  Right rotation WFL 60 deg  Left rotation WFL 60 deg   (Blank rows = not tested) JOINT MOBILITY: Cervical UPA's C2-C7: hypomobile and concordant pain. Reproduces median nerve N/T in palmar aspect of L hand and digits 1-3 at C5-C6 region  Cervical CPA's C2-C7: hypomobile and concordant pain. Reproduces median nerve N/T in palmar aspect of L hand and digits 1-3 at C5-C6 region Cervical R/L side glides C2-C7: hypomobile and painless on R side. Hypomobile and concordant pain with all segments. Reproduces median nerve N/T in palmar aspect of L hand and digits 1-3 at C5-C6 region.  ULTT: LUE Median: positive  Radial: negative  Ulnar: negative   SHOULDER AROM: Flexion/extension WNL bilat and painless  LUMBAR ROM:   AROM eval  Flexion WFL  Extension WFL  Right lateral flexion WFL  Left lateral flexion WFL  Right rotation [facility]  Left rotation WFL   (Blank rows = not tested)  UPPER EXTREMITY MMT:  MMT Right eval Left eval Right 11/12 Left  11/12 Right 04/06/24 Left  04/06/24   Shoulder flexion 4 4   4+ 4+  Shoulder extension         Shoulder abduction 4 4   4+ 4+  Shoulder adduction        Shoulder extension   4* 4 4+ 4+  Shoulder internal rotation        Shoulder external rotation        Middle trapezius   5* 5    Lower trapezius   4* 4 4+ 4+  Elbow flexion 4+ 4+      Elbow extension 4+ 4+      Wrist flexion        Wrist extension        Wrist ulnar deviation        Wrist radial deviation        Wrist pronation        Wrist supination        Grip strength         (Blank rows = not tested)   LOWER EXTREMITY MMT:    MMT Right eval Left eval Right  04/06/24  Left  04/06/24   Hip flexion 4 4 4+ 4+  Hip extension      Hip abduction 4 4 4+ 4+  Hip adduction 4+ 4+    Hip internal rotation      Hip external rotation      Knee flexion 4+ 4+    Knee extension 4+ 4+    Ankle dorsiflexion 4+ 4+    Ankle plantarflexion      Ankle inversion      Ankle  eversion       (Blank rows = not tested)  LUMBAR SPECIAL TESTS:  Straight leg raise test: Positive on L and FABER test: Negative  CERVICAL SPECIAL TESTS:   Spurling's 02/18/24: Negative A and B for L sided symptoms ; Distraction: positive - relieves L sided neck and arm symptoms     GAIT: Distance walked: 65' Assistive device utilized: None Level of assistance: Complete Independence Comments: no obvious gait deviations appreciated   TREATMENT DATE:   04/06/24: THEREX    UBE with seat at 11 resistance at 4  - 2 min forward and 2 min backward   NRPS: 1-2/10  Neck Disability Index: 10% (5/50)   See LE and UE MMT above   Lower Trap Setting at wall with green band 1 x 10   Bird Dog 2 x 10   Bird Dog 2 x 10 with #3 DB Education about progressions for therapeutic exercise including increased resistance with seated rows.  Standing Lower Trap Setting with Green TB  1 x 10    03/10/24             THEREX   UBE with seat at 6 resistance at 2 - 2 min forward and 2 min backward    Lower Trap Setting Lift Off 1 x 10   Standing D2 Flexion/ Extension 2  x 10   -Pt reports increased pain localized to left posterior delt pain  Bent over Y's on LUE 1 x 10  -Pt reports increased pain localized to left posterior delt pain  Lat Pull Down with Extended elbow for lower trap activation # 20 2 x 10  Shoulder AROM: WFL for flexion and Abduction, crepitus noted in left shoulder  Shoulder PROM: WFL with no pain in left shoulder with abduction  Painful Arc: + on LUE   Lift Off with red band around hands 2 x 10     PATIENT EDUCATION:  Education details: HEP, POC, goals  Person educated: Patient Education method: Explanation, Demonstration, and Handouts Education comprehension: verbalized understanding and returned demonstration  HOME EXERCISE PROGRAM: Access Code: [email or web address] URL: [email or web address] Date: 04/06/2024 Prepared by: Toribio Servant  Exercises - Seated Cervical Retraction  - 1 x daily - 7 x weekly - 3 sets - 6 reps - Seated Hamstring Stretch  - 1-2 x daily - 5-7 x weekly - 3-5 reps - 30-60 seconds  hold - Seated Piriformis Stretch  - 1-2 x daily - 5-7 x weekly - 3-5 reps - 30-60 seconds hold - Seated Upper Trapezius Stretch  - 1-2 x daily - 5-7 x weekly - 3-5 reps - 30-60 seconds hold - Seated Levator Scapulae Stretch  - 1-2 x daily - 5-7 x weekly - 3-5 reps - 30-60 seconds  hold - Median Nerve Flossing - Tray  - 1 x daily - 7 x weekly - 2 sets - 8 reps - Scapular Retraction with Resistance  - 3-4 x weekly - 3 sets - 15 reps - Standing Low Trap Setting with Resistance at Wall  - 3-4 x weekly - 3 sets - 10 reps    ASSESSMENT:  CLINICAL IMPRESSION: Pt has now met all of his rehab goals with an improvement in UE and LE strength, a decrease in his cervical pain and headaches, and improvement in cervical function. He is now ready for discharge and he has been given a home exercise plan to continue to maintain his functional improvement.    OBJECTIVE IMPAIRMENTS: decreased activity tolerance, decreased endurance,  difficulty walking, decreased ROM, decreased strength, impaired flexibility, postural dysfunction, and pain.   ACTIVITY LIMITATIONS: carrying, lifting, sleeping, and locomotion level  PARTICIPATION LIMITATIONS: cleaning, community activity, and yard work  PERSONAL FACTORS: Age, Behavior pattern, and Time since onset of injury/illness/exacerbation are also affecting patient's functional outcome.   REHAB POTENTIAL: Good  CLINICAL DECISION MAKING: Evolving/moderate complexity  EVALUATION COMPLEXITY: Moderate   GOALS: Goals reviewed with patient? Yes  SHORT TERM GOALS: Target date: 03/10/2024  Patient will be independent in HEP to improve strength/mobility for better functional independence with ADLs. Baseline: NT  03/10/24: Performing independently    Goal status: ACHIEVED   LONG TERM GOALS: Target date: 04/07/2024  Patient will reduce Neck Disability Index score to <10% to demonstrate minimal disability with ADL's including improved sleeping tolerance, sitting tolerance, etc for better mobility at home and work. Baseline: 02/11/24: 8/50 = 16% 03/10/24: 13/50 (26%)  04/06/24: 5/50 (10%) Goal status: PARTIALLY ACHIEVED    2.  Patient will report decrease in frequency of headaches to <2/week to demonstrate improved muscle tightness of upper traps and levator and improve tolerance to activity without frequent headaches.  Baseline: 02/11/24: frequent (daily) 03/10/24: 4-5x per week    04/06/24 1x in past week   Goal status: ACHIEVED     3.  Patient will improve B UE/LE strength by 1/3 MMT grade to demonstrate improved functional strength to be able to complete ADLs and work duties.  Baseline: 02/11/24: see above 04/06/25: See above     Goal status: ACHIEVED    4.  Patient will report a worst pain of 3/10 on NRPS in neck and low back to improve tolerance with ADLs and reduced symptoms with activities.  Baseline: 02/11/24: 10/10 pain  03/10/24: 4/10 in low back and neck and left shoulder   04/06/24: 1-2/10 NRPS in low back and neck    Goal status: ACHIEVED      PLAN:  PT FREQUENCY: 1-2x/week  PT DURATION: 8 weeks  PLANNED INTERVENTIONS: 97164- PT Re-evaluation, 97750- Physical Performance Testing, 97110-Therapeutic exercises, 97530- Therapeutic activity, 97112- Neuromuscular re-education, 97535- Self Care, [id number]- Manual therapy, 97116- Gait training, [phone]- Electrical stimulation (unattended), [phone]- Electrical stimulation (manual), 20560 (1-2 muscles), 20561 (3+ muscles)- Dry Needling, Patient/Family education, Taping, Joint mobilization, Joint manipulation, Spinal manipulation, Spinal mobilization, Cryotherapy, and Moist heat.  PLAN FOR NEXT SESSION: Discharge from PT    Toribio Servant PT, DPT  [facility] [address], [place], [address] Phone: [phone]   Fax:  [phone]  "

## 2024-05-18 ENCOUNTER — Ambulatory Visit: Payer: Self-pay | Admitting: Internal Medicine

## 2024-06-01 ENCOUNTER — Ambulatory Visit: Admitting: Internal Medicine
